# Patient Record
Sex: Male | Born: 1975 | Race: White | Hispanic: No | Marital: Married | Smoking: Former smoker
Health system: Southern US, Community
[De-identification: ages and names within clinical notes are randomized; demographics above are authoritative.]

## PROBLEM LIST (undated history)

## (undated) DIAGNOSIS — F419 Anxiety disorder, unspecified: Secondary | ICD-10-CM

## (undated) DIAGNOSIS — L659 Nonscarring hair loss, unspecified: Secondary | ICD-10-CM

## (undated) HISTORY — PX: EYE SURGERY: SHX253

## (undated) HISTORY — PX: ANTERIOR CRUCIATE LIGAMENT REPAIR: SHX115

## (undated) HISTORY — DX: Nonscarring hair loss, unspecified: L65.9

## (undated) HISTORY — PX: OTHER SURGICAL HISTORY: SHX169

## (undated) HISTORY — PX: PATELLA FRACTURE SURGERY: SHX735

## (undated) HISTORY — DX: Anxiety disorder, unspecified: F41.9

---

## 2012-12-13 ENCOUNTER — Encounter: Payer: Self-pay | Admitting: Family Medicine

## 2012-12-13 DIAGNOSIS — F419 Anxiety disorder, unspecified: Secondary | ICD-10-CM | POA: Insufficient documentation

## 2012-12-13 DIAGNOSIS — L659 Nonscarring hair loss, unspecified: Secondary | ICD-10-CM | POA: Insufficient documentation

## 2012-12-13 DIAGNOSIS — Z8349 Family history of other endocrine, nutritional and metabolic diseases: Secondary | ICD-10-CM | POA: Insufficient documentation

## 2012-12-14 ENCOUNTER — Ambulatory Visit: Payer: Self-pay | Admitting: Physician Assistant

## 2013-09-14 DIAGNOSIS — Z7982 Long term (current) use of aspirin: Secondary | ICD-10-CM | POA: Insufficient documentation

## 2013-09-14 DIAGNOSIS — Y9239 Other specified sports and athletic area as the place of occurrence of the external cause: Secondary | ICD-10-CM | POA: Insufficient documentation

## 2013-09-14 DIAGNOSIS — S82009A Unspecified fracture of unspecified patella, initial encounter for closed fracture: Secondary | ICD-10-CM | POA: Insufficient documentation

## 2013-09-14 DIAGNOSIS — Z8659 Personal history of other mental and behavioral disorders: Secondary | ICD-10-CM | POA: Insufficient documentation

## 2013-09-14 DIAGNOSIS — Z792 Long term (current) use of antibiotics: Secondary | ICD-10-CM | POA: Insufficient documentation

## 2013-09-14 DIAGNOSIS — Y92838 Other recreation area as the place of occurrence of the external cause: Secondary | ICD-10-CM

## 2013-09-14 DIAGNOSIS — Y9362 Activity, american flag or touch football: Secondary | ICD-10-CM | POA: Insufficient documentation

## 2013-09-14 DIAGNOSIS — Z87891 Personal history of nicotine dependence: Secondary | ICD-10-CM | POA: Insufficient documentation

## 2013-09-14 DIAGNOSIS — W219XXA Striking against or struck by unspecified sports equipment, initial encounter: Secondary | ICD-10-CM | POA: Insufficient documentation

## 2013-09-15 ENCOUNTER — Encounter (HOSPITAL_COMMUNITY): Payer: Self-pay | Admitting: Emergency Medicine

## 2013-09-15 ENCOUNTER — Emergency Department (HOSPITAL_COMMUNITY)
Admission: EM | Admit: 2013-09-15 | Discharge: 2013-09-15 | Disposition: A | Discharge status: Home / Self Care | Payer: BC Managed Care – PPO | Attending: Emergency Medicine | Admitting: Emergency Medicine

## 2013-09-15 ENCOUNTER — Encounter (HOSPITAL_COMMUNITY): Payer: Self-pay | Admitting: *Deleted

## 2013-09-15 ENCOUNTER — Emergency Department (HOSPITAL_COMMUNITY): Payer: BC Managed Care – PPO

## 2013-09-15 DIAGNOSIS — S82001A Unspecified fracture of right patella, initial encounter for closed fracture: Secondary | ICD-10-CM

## 2013-09-15 MED ORDER — OXYCODONE-ACETAMINOPHEN 5-325 MG PO TABS: 2.0000 | ORAL_TABLET | ORAL | Status: DC | PRN

## 2013-09-15 MED ORDER — MORPHINE SULFATE 4 MG/ML IJ SOLN
4.0000 mg | Freq: Once | INTRAMUSCULAR | Status: AC
  Administered 2013-09-15: 4 mg via INTRAMUSCULAR
  Filled 2013-09-15: qty 1

## 2013-09-15 MED ORDER — OXYCODONE-ACETAMINOPHEN 5-325 MG PO TABS: 1.0000 | ORAL_TABLET | Freq: Once | ORAL | Status: DC

## 2013-09-15 MED ORDER — OXYCODONE-ACETAMINOPHEN 5-325 MG PO TABS
1.0000 | ORAL_TABLET | Freq: Once | ORAL | Status: AC
  Administered 2013-09-15: 1 via ORAL
  Filled 2013-09-15: qty 1

## 2013-09-15 NOTE — Discharge Instructions (Signed)
Called [facility] to be seen tomorrow. Keep leg elevated. May use ice to decrease swelling. Take medication as prescribed.  Patellar Fracture, Adult A patellar fracture is a break in the little round bone (kneecap) that is the bump on the front of your knee. A direct blow to the knee, or fall, is usually the cause of a broken patella. Sometimes, a very hard and strong bending of the knee (like jumping events in sports) can cause a fracture. Usually the knee is tender and swollen and has pain with motion, especially trying to straighten out the leg. There may be difficulty walking or putting weight on the affected side. These fractures generally heal in about 4 to 6 weeks. DIAGNOSIS  The diagnosis is usually easily made with an exam and x-ray. TREATMENT  Treatment is dependent on the type of fracture:  If the fracture is undisplaced or only slightly displaced (this means the position of the parts is good) then, after any blood in the joint has been removed, and if you can still straighten your leg out, you can usually be treated with a splint or cast for 4 to 6 weeks. Straightening out your leg is called extension and the ability to do this is with the extensor mechanism. Every day while in treatment quadriceps exercises should be performed, or as directed by your caregivers.  If there is a stellate (comminuted) fracture of the patella (this means the patella is in multiple small pieces), the blood in the joint may be removed and if you are able to straighten your leg, you can usually be treated with a splint or cast for 4 to 6 weeks. Sometimes this type of fracture may be treated by removing the patella. You will still have a good knee without a patella. If this is done the knee still will need to be in a plaster cast or splint for the next 4 to 6 weeks.  If the fracture is a displaced transverse fracture and you cannot extend (straighten out your leg), then an operation is required to hold  the bony fragments together until they heal. Again a plaster cast or splint is worn until the extension mechanism of the knee is regained. This means the knee is healed and you can straighten out your leg again normally.  Only take over-the-counter or prescription medicines for pain, discomfort, or fever as directed by your caregiver.  Warning: Do not drive a car or operate a motor vehicle until your caregiver specifically tells you it is safe to do so. HOME CARE INSTRUCTIONS   You may resume normal diet and activities as directed or allowed.  Keep ice packs (a bag of ice wrapped in a towel) on the knee for twenty minutes, four times per day, for the first two days.  Change dressings if necessary or as directed.  Only take over-the-counter or prescription medicines for pain, discomfort, or fever as directed by your caregiver.  Use crutches as directed and exercise leg as directed.  Keep appointments as directed. SEEK IMMEDIATE MEDICAL CARE IF :  Redness, swelling, or increasing pain in the knee.  An unexplained oral temperature above 102 F (38.9 C) develops. Document Released: 05/23/2003 Document Revised: 11/16/2011 Document Reviewed: 04/05/2013 [facility] Patient Information 2014 [place], [place].

## 2013-09-15 NOTE — ED Notes (Signed)
Ran into wall while playing football and hyperextended right knee, swelling noted, no other injuries, A/O X4 and in NAD

## 2013-09-15 NOTE — Progress Notes (Signed)
Surgery scheduled for 09/19/13.  Need orders in EPIC.  Thank You.

## 2013-09-15 NOTE — Progress Notes (Signed)
Patient has hx of marijuana use.  Last used on 09/14/13.  Pt also reports use of Xanax not prescribed to patient.  Last use of Xanax 09/07/2013 approximately.  Urine Drug screen ordered on patient for day of surgery.  Patient does not know dose of Xanax.  Describes as a blue football.

## 2013-09-18 ENCOUNTER — Other Ambulatory Visit: Payer: Self-pay | Admitting: Orthopedic Surgery

## 2013-09-19 ENCOUNTER — Encounter (HOSPITAL_COMMUNITY)
Admission: RE | Disposition: A | Discharge status: Home / Self Care | Payer: Self-pay | Source: Ambulatory Visit | Attending: Specialist

## 2013-09-19 ENCOUNTER — Ambulatory Visit (HOSPITAL_COMMUNITY)
Admission: RE | Admit: 2013-09-19 | Discharge: 2013-09-19 | Disposition: A | Discharge status: Home / Self Care | Payer: BC Managed Care – PPO | Source: Ambulatory Visit | Attending: Specialist | Admitting: Specialist

## 2013-09-19 ENCOUNTER — Encounter (HOSPITAL_COMMUNITY): Payer: Self-pay | Admitting: *Deleted

## 2013-09-19 ENCOUNTER — Encounter (HOSPITAL_COMMUNITY): Payer: BC Managed Care – PPO | Admitting: Anesthesiology

## 2013-09-19 ENCOUNTER — Ambulatory Visit (HOSPITAL_COMMUNITY): Payer: BC Managed Care – PPO | Admitting: Anesthesiology

## 2013-09-19 DIAGNOSIS — Y9367 Activity, basketball: Secondary | ICD-10-CM | POA: Insufficient documentation

## 2013-09-19 DIAGNOSIS — F17200 Nicotine dependence, unspecified, uncomplicated: Secondary | ICD-10-CM | POA: Insufficient documentation

## 2013-09-19 DIAGNOSIS — S82009A Unspecified fracture of unspecified patella, initial encounter for closed fracture: Secondary | ICD-10-CM

## 2013-09-19 DIAGNOSIS — X58XXXA Exposure to other specified factors, initial encounter: Secondary | ICD-10-CM | POA: Insufficient documentation

## 2013-09-19 HISTORY — PX: ORIF PATELLA: SHX5033

## 2013-09-19 LAB — RAPID URINE DRUG SCREEN, HOSP PERFORMED
AMPHETAMINES: NOT DETECTED
Barbiturates: NOT DETECTED
Benzodiazepines: NOT DETECTED
COCAINE: NOT DETECTED
Opiates: NOT DETECTED
TETRAHYDROCANNABINOL: POSITIVE — AB

## 2013-09-19 LAB — COMPREHENSIVE METABOLIC PANEL
ALBUMIN: 4.2 g/dL (ref 3.5–5.2)
ALT: 36 U/L (ref 0–53)
AST: 27 U/L (ref 0–37)
Alkaline Phosphatase: 69 U/L (ref 39–117)
BUN: 13 mg/dL (ref 6–23)
CALCIUM: 9.2 mg/dL (ref 8.4–10.5)
CO2: 26 meq/L (ref 19–32)
CREATININE: 0.87 mg/dL (ref 0.50–1.35)
Chloride: 102 mEq/L (ref 96–112)
GFR calc Af Amer: >90 mL/min (ref >90)
Glucose, Bld: 97 mg/dL (ref 70–99)
Potassium: 4.2 mEq/L (ref 3.7–5.3)
SODIUM: 139 meq/L (ref 137–147)
TOTAL PROTEIN: 7.1 g/dL (ref 6.0–8.3)
Total Bilirubin: 0.7 mg/dL (ref 0.3–1.2)

## 2013-09-19 LAB — CBC
HCT: 41.9 % (ref 39.0–52.0)
Hemoglobin: 15.3 g/dL (ref 13.0–17.0)
MCH: 32 pg (ref 26.0–34.0)
MCHC: 36.5 g/dL — AB (ref 30.0–36.0)
MCV: 87.7 fL (ref 78.0–100.0)
PLATELETS: 242 10*3/uL (ref 150–400)
RBC: 4.78 MIL/uL (ref 4.22–5.81)
RDW: 12.7 % (ref 11.5–15.5)
WBC: 13.9 10*3/uL — ABNORMAL HIGH (ref 4.0–10.5)

## 2013-09-19 SURGERY — OPEN REDUCTION INTERNAL FIXATION (ORIF) PATELLA
Anesthesia: Regional | Site: Knee | Laterality: Right

## 2013-09-19 MED ORDER — ASPIRIN EC 325 MG PO TBEC: 325.0000 mg | DELAYED_RELEASE_TABLET | Freq: Two times a day (BID) | ORAL | Status: DC

## 2013-09-19 MED ORDER — LACTATED RINGERS IV SOLN: INTRAVENOUS | Status: DC

## 2013-09-19 MED ORDER — MIDAZOLAM HCL 5 MG/5ML IJ SOLN
INTRAMUSCULAR | Status: DC | PRN
  Administered 2013-09-19: 2 mg via INTRAVENOUS

## 2013-09-19 MED ORDER — ONDANSETRON HCL 4 MG/2ML IJ SOLN
INTRAMUSCULAR | Status: DC | PRN
  Administered 2013-09-19: 4 mg via INTRAVENOUS

## 2013-09-19 MED ORDER — POVIDONE-IODINE 7.5 % EX SOLN: Freq: Once | CUTANEOUS | Status: DC

## 2013-09-19 MED ORDER — OXYCODONE-ACETAMINOPHEN 5-325 MG PO TABS
ORAL_TABLET | ORAL | Status: AC
  Filled 2013-09-19: qty 1

## 2013-09-19 MED ORDER — BUPIVACAINE HCL (PF) 0.25 % IJ SOLN
INTRAMUSCULAR | Status: AC
  Filled 2013-09-19: qty 30

## 2013-09-19 MED ORDER — FENTANYL CITRATE 0.05 MG/ML IJ SOLN
INTRAMUSCULAR | Status: AC
  Filled 2013-09-19: qty 2

## 2013-09-19 MED ORDER — MIDAZOLAM HCL 2 MG/2ML IJ SOLN
INTRAMUSCULAR | Status: AC
  Filled 2013-09-19: qty 2

## 2013-09-19 MED ORDER — ROPIVACAINE HCL 5 MG/ML IJ SOLN
INTRAMUSCULAR | Status: AC
  Filled 2013-09-19: qty 30

## 2013-09-19 MED ORDER — DEXAMETHASONE SODIUM PHOSPHATE 10 MG/ML IJ SOLN
INTRAMUSCULAR | Status: DC | PRN
  Administered 2013-09-19: 10 mg via INTRAVENOUS

## 2013-09-19 MED ORDER — HYDROMORPHONE HCL PF 1 MG/ML IJ SOLN
INTRAMUSCULAR | Status: DC
Start: 2013-09-19 — End: 2013-09-19
  Filled 2013-09-19: qty 1

## 2013-09-19 MED ORDER — HYDROMORPHONE HCL PF 1 MG/ML IJ SOLN: 0.2500 mg | INTRAMUSCULAR | Status: DC | PRN

## 2013-09-19 MED ORDER — OXYCODONE-ACETAMINOPHEN 7.5-325 MG PO TABS: 1.0000 | ORAL_TABLET | ORAL | Status: DC | PRN

## 2013-09-19 MED ORDER — FENTANYL CITRATE 0.05 MG/ML IJ SOLN
INTRAMUSCULAR | Status: DC | PRN
  Administered 2013-09-19: 25 ug via INTRAVENOUS
  Administered 2013-09-19: 50 ug via INTRAVENOUS
  Administered 2013-09-19: 25 ug via INTRAVENOUS
  Administered 2013-09-19: 50 ug via INTRAVENOUS
  Administered 2013-09-19 (×4): 25 ug via INTRAVENOUS
  Administered 2013-09-19: 50 ug via INTRAVENOUS
  Administered 2013-09-19: 25 ug via INTRAVENOUS
  Administered 2013-09-19: 50 ug via INTRAVENOUS
  Administered 2013-09-19: 25 ug via INTRAVENOUS

## 2013-09-19 MED ORDER — ROPIVACAINE HCL 5 MG/ML IJ SOLN
INTRAMUSCULAR | Status: DC | PRN
  Administered 2013-09-19: 30 mL via PERINEURAL

## 2013-09-19 MED ORDER — KETOROLAC TROMETHAMINE 30 MG/ML IJ SOLN
INTRAMUSCULAR | Status: AC
  Filled 2013-09-19: qty 1

## 2013-09-19 MED ORDER — CEFAZOLIN SODIUM-DEXTROSE 2-3 GM-% IV SOLR
2.0000 g | INTRAVENOUS | Status: AC
  Administered 2013-09-19: 2 g via INTRAVENOUS

## 2013-09-19 MED ORDER — CEPHALEXIN 500 MG PO CAPS: 500.0000 mg | ORAL_CAPSULE | Freq: Three times a day (TID) | ORAL | Status: DC

## 2013-09-19 MED ORDER — METHOCARBAMOL 500 MG PO TABS: 500.0000 mg | ORAL_TABLET | Freq: Four times a day (QID) | ORAL | Status: DC | PRN

## 2013-09-19 MED ORDER — PROPOFOL 10 MG/ML IV BOLUS
INTRAVENOUS | Status: AC
  Filled 2013-09-19: qty 20

## 2013-09-19 MED ORDER — PROMETHAZINE HCL 25 MG/ML IJ SOLN: 6.2500 mg | INTRAMUSCULAR | Status: DC | PRN

## 2013-09-19 MED ORDER — SODIUM CHLORIDE 0.9 % IV SOLN
INTRAVENOUS | Status: DC
Start: 2013-09-19 — End: 2013-09-20

## 2013-09-19 MED ORDER — PROPOFOL 10 MG/ML IV BOLUS
INTRAVENOUS | Status: DC | PRN
  Administered 2013-09-19: 50 mg via INTRAVENOUS
  Administered 2013-09-19: 200 mg via INTRAVENOUS

## 2013-09-19 MED ORDER — CEFAZOLIN SODIUM-DEXTROSE 2-3 GM-% IV SOLR
2.0000 g | Freq: Once | INTRAVENOUS | Status: DC
  Filled 2013-09-19: qty 50

## 2013-09-19 MED ORDER — OXYCODONE-ACETAMINOPHEN 5-325 MG PO TABS
1.0000 | ORAL_TABLET | ORAL | Status: DC | PRN
  Administered 2013-09-19: 1 via ORAL

## 2013-09-19 MED ORDER — BUPIVACAINE HCL 0.25 % IJ SOLN
INTRAMUSCULAR | Status: DC | PRN
  Administered 2013-09-19: 10 mL

## 2013-09-19 MED ORDER — CEFAZOLIN SODIUM-DEXTROSE 2-3 GM-% IV SOLR
INTRAVENOUS | Status: AC
  Filled 2013-09-19: qty 50

## 2013-09-19 MED ORDER — DEXAMETHASONE SODIUM PHOSPHATE 10 MG/ML IJ SOLN
INTRAMUSCULAR | Status: AC
  Filled 2013-09-19: qty 1

## 2013-09-19 MED ORDER — 0.9 % SODIUM CHLORIDE (POUR BTL) OPTIME
TOPICAL | Status: DC | PRN
  Administered 2013-09-19: 1000 mL

## 2013-09-19 MED ORDER — KETOROLAC TROMETHAMINE 30 MG/ML IJ SOLN
INTRAMUSCULAR | Status: DC | PRN
  Administered 2013-09-19: 30 mg via INTRAVENOUS

## 2013-09-19 MED ORDER — LACTATED RINGERS IV SOLN
INTRAVENOUS | Status: DC | PRN
  Administered 2013-09-19 (×2): via INTRAVENOUS

## 2013-09-19 MED ORDER — ONDANSETRON HCL 4 MG/2ML IJ SOLN
INTRAMUSCULAR | Status: AC
  Filled 2013-09-19: qty 2

## 2013-09-19 SURGICAL SUPPLY — 57 items
ADH SKN CLS APL DERMABOND .7 (GAUZE/BANDAGES/DRESSINGS) ×1
BAG SPEC THK2 15X12 ZIP CLS (MISCELLANEOUS) ×1
BAG ZIPLOCK 12X15 (MISCELLANEOUS) ×2 IMPLANT
BANDAGE ELASTIC 4 VELCRO ST LF (GAUZE/BANDAGES/DRESSINGS) ×1 IMPLANT
BANDAGE ELASTIC 6 VELCRO ST LF (GAUZE/BANDAGES/DRESSINGS) ×1 IMPLANT
BNDG COHESIVE 4X5 TAN STRL (GAUZE/BANDAGES/DRESSINGS) ×1 IMPLANT
BNDG COHESIVE 6X5 TAN STRL LF (GAUZE/BANDAGES/DRESSINGS) ×1 IMPLANT
CUFF TOURN SGL QUICK 34 (TOURNIQUET CUFF) ×2
CUFF TRNQT CYL 34X4X40X1 (TOURNIQUET CUFF) ×1 IMPLANT
DERMABOND ADVANCED (GAUZE/BANDAGES/DRESSINGS) ×1
DERMABOND ADVANCED .7 DNX12 (GAUZE/BANDAGES/DRESSINGS) IMPLANT
DRAPE INCISE IOBAN 66X45 STRL (DRAPES) ×2 IMPLANT
DRAPE LG THREE QUARTER DISP (DRAPES) ×2 IMPLANT
DRAPE OEC MINIVIEW 54X84 (DRAPES) ×2 IMPLANT
DRAPE U-SHAPE 47X51 STRL (DRAPES) ×2 IMPLANT
DRSG AQUACEL AG ADV 3.5X10 (GAUZE/BANDAGES/DRESSINGS) ×1 IMPLANT
DRSG EMULSION OIL 3X16 NADH (GAUZE/BANDAGES/DRESSINGS) ×2 IMPLANT
DURAPREP 26ML APPLICATOR (WOUND CARE) ×2 IMPLANT
ELECT REM PT RETURN 9FT ADLT (ELECTROSURGICAL) ×2
ELECTRODE REM PT RTRN 9FT ADLT (ELECTROSURGICAL) ×1 IMPLANT
GLOVE BIOGEL PI IND STRL 8 (GLOVE) ×1 IMPLANT
GLOVE BIOGEL PI INDICATOR 8 (GLOVE) ×1
GLOVE SURG ORTHO 8.0 STRL STRW (GLOVE) ×2 IMPLANT
GLOVE SURG SS PI 7.5 STRL IVOR (GLOVE) ×2 IMPLANT
GOWN STRL REUS W/TWL XL LVL3 (GOWN DISPOSABLE) ×2 IMPLANT
IMMOBILIZER KNEE 20 (SOFTGOODS) ×2 IMPLANT
IMMOBILIZER KNEE 22 UNIV (SOFTGOODS) ×1 IMPLANT
K-WIRE 2.0X150M (WIRE) ×8
K-WIRE ACE 1.6X6 (WIRE) ×8
KIT BASIN OR (CUSTOM PROCEDURE TRAY) ×2 IMPLANT
KWIRE 2.0X150M (WIRE) IMPLANT
KWIRE ACE 1.6X6 (WIRE) IMPLANT
MANIFOLD NEPTUNE II (INSTRUMENTS) ×2 IMPLANT
NDL MA TROC 1/2 (NEEDLE) ×1 IMPLANT
NEEDLE MA TROC 1/2 (NEEDLE) ×2 IMPLANT
NS IRRIG 1000ML POUR BTL (IV SOLUTION) ×2 IMPLANT
PACK LOWER EXTREMITY WL (CUSTOM PROCEDURE TRAY) ×2 IMPLANT
PAD CAST 4YDX4 CTTN HI CHSV (CAST SUPPLIES) ×2 IMPLANT
PADDING CAST COTTON 4X4 STRL (CAST SUPPLIES) ×4
PASSER SUT SWANSON 36MM LOOP (INSTRUMENTS) ×2 IMPLANT
POSITIONER SURGICAL ARM (MISCELLANEOUS) ×2 IMPLANT
SPONGE GAUZE 4X4 12PLY (GAUZE/BANDAGES/DRESSINGS) ×4 IMPLANT
SPONGE LAP 18X18 X RAY DECT (DISPOSABLE) ×2 IMPLANT
STOCKINETTE 8 INCH (MISCELLANEOUS) ×2 IMPLANT
STRIP CLOSURE SKIN 1/2X4 (GAUZE/BANDAGES/DRESSINGS) ×2 IMPLANT
SUCTION FRAZIER TIP 10 FR DISP (SUCTIONS) ×2 IMPLANT
SUT ETHILON 4 0 PS 2 18 (SUTURE) ×2 IMPLANT
SUT FIBERWIRE #2 38 T-5 BLUE (SUTURE) ×6
SUT MNCRL AB 4-0 PS2 18 (SUTURE) ×2 IMPLANT
SUT VIC AB 1 CT1 27 (SUTURE) ×4
SUT VIC AB 1 CT1 27XBRD ANTBC (SUTURE) ×2 IMPLANT
SUT VIC AB 2-0 CT1 27 (SUTURE) ×4
SUT VIC AB 2-0 CT1 TAPERPNT 27 (SUTURE) ×2 IMPLANT
SUT WIRE 18GA (Wire) ×4 IMPLANT
SUTURE FIBERWR #2 38 T-5 BLUE (SUTURE) ×3 IMPLANT
TOWEL OR 17X26 10 PK STRL BLUE (TOWEL DISPOSABLE) ×4 IMPLANT
WATER STERILE IRR 1500ML POUR (IV SOLUTION) ×2 IMPLANT

## 2013-09-19 NOTE — H&P (View-Only) (Signed)
Surgery scheduled for 09/19/13.  Need orders in EPIC.  Thank You.  

## 2013-09-19 NOTE — Op Note (Signed)
Dictated # [id number]

## 2013-09-19 NOTE — Interval H&P Note (Signed)
History and Physical Interval Note:  09/19/2013 5:29 PM  Theodore Sanchez  has presented today for surgery, with the diagnosis of right knee patella fracture  The various methods of treatment have been discussed with the patient and family. After consideration of risks, benefits and other options for treatment, the patient has consented to  Procedure(s): OPEN REDUCTION INTERNAL (ORIF) FIXATION RIGHT KNEE PATELLA (Right) as a surgical intervention .  The patient's history has been reviewed, patient examined, no change in status, stable for surgery.  I have reviewed the patient's chart and labs.  Questions were answered to the patient's satisfaction.     [name] ANDREW   

## 2013-09-19 NOTE — Interval H&P Note (Signed)
History and Physical Interval Note:  09/19/2013 5:48 PM  Theodore Sanchez  has presented today for surgery, with the diagnosis of right knee patella fracture  The various methods of treatment have been discussed with the patient and family. After consideration of risks, benefits and other options for treatment, the patient has consented to  Procedure(s): OPEN REDUCTION INTERNAL (ORIF) FIXATION RIGHT KNEE PATELLA (Right) as a surgical intervention .  The patient's history has been reviewed, patient examined, no change in status, stable for surgery.  I have reviewed the patient's chart and labs.  Questions were answered to the patient's satisfaction.     Miosotis Wetsel ANDREW

## 2013-09-19 NOTE — Interval H&P Note (Signed)
History and Physical Interval Note:  09/19/2013 5:29 PM  Theodore Sanchez  has presented today for surgery, with the diagnosis of right knee patella fracture  The various methods of treatment have been discussed with the patient and family. After consideration of risks, benefits and other options for treatment, the patient has consented to  Procedure(s): OPEN REDUCTION INTERNAL (ORIF) FIXATION RIGHT KNEE PATELLA (Right) as a surgical intervention .  The patient's history has been reviewed, patient examined, no change in status, stable for surgery.  I have reviewed the patient's chart and labs.  Questions were answered to the patient's satisfaction.     Elysha Daw ANDREW

## 2013-09-19 NOTE — Interval H&P Note (Signed)
History and Physical Interval Note:  09/19/2013 5:48 PM  Theodore Sanchez  has presented today for surgery, with the diagnosis of right knee patella fracture  The various methods of treatment have been discussed with the patient and family. After consideration of risks, benefits and other options for treatment, the patient has consented to  Procedure(s): OPEN REDUCTION INTERNAL (ORIF) FIXATION RIGHT KNEE PATELLA (Right) as a surgical intervention .  The patient's history has been reviewed, patient examined, no change in status, stable for surgery.  I have reviewed the patient's chart and labs.  Questions were answered to the patient's satisfaction.     [name] ANDREW   

## 2013-09-19 NOTE — H&P (View-Only) (Signed)
Patient has hx of marijuana use.  Last used on 09/14/13.  Pt also reports use of Xanax not prescribed to patient.  Last use of Xanax 09/07/2013 approximately.  Urine Drug screen ordered on patient for day of surgery.  Patient does not know dose of Xanax.  Describes as a blue football.   

## 2013-09-19 NOTE — Anesthesia Procedure Notes (Signed)
Anesthesia Regional Block:  Femoral nerve block  Pre-Anesthetic Checklist: ,, timeout performed, Correct Patient, Correct Site, Correct Laterality, Correct Procedure, Correct Position, site marked, Risks and benefits discussed,  Surgical consent,  Pre-op evaluation,  At surgeon's request and post-op pain management  Laterality: Right and Lower  Prep: chloraprep       Needles:  Injection technique: Single-shot  Needle Type: Stimulator Needle - 80      Needle Gauge: 22 and 22 G    Additional Needles:  Procedures: ultrasound guided (picture in chart) and nerve stimulator Femoral nerve block Narrative:  Start time: 09/19/2013 4:30 PM End time: 09/19/2013 4:40 PM Injection made incrementally with aspirations every 5 mL.  Performed by: Personally  Anesthesiologist: Lucille PassyAlexander Courtenay Creger MD

## 2013-09-19 NOTE — Anesthesia Preprocedure Evaluation (Signed)
Anesthesia Evaluation  Patient identified by MRN, date of birth, ID band Patient awake    Reviewed: Allergy & Precautions, H&P , NPO status , Patient's Chart, lab work & pertinent test results  Airway Mallampati: II TM Distance: >3 FB Neck ROM: Full    Dental  (+) Teeth Intact and Dental Advisory Given   Pulmonary neg pulmonary ROS, former smoker,  breath sounds clear to auscultation  Pulmonary exam normal       Cardiovascular negative cardio ROS  Rhythm:Regular Rate:Normal     Neuro/Psych negative neurological ROS  negative psych ROS   GI/Hepatic negative GI ROS, Neg liver ROS,   Endo/Other  negative endocrine ROS  Renal/GU negative Renal ROS  negative genitourinary   Musculoskeletal negative musculoskeletal ROS (+)   Abdominal   Peds negative pediatric ROS (+)  Hematology negative hematology ROS (+)   Anesthesia Other Findings   Reproductive/Obstetrics negative OB ROS                           Anesthesia Physical Anesthesia Plan  ASA: I  Anesthesia Plan: General and Regional   Post-op Pain Management:    Induction: Intravenous  Airway Management Planned: Oral ETT  Additional Equipment:   Intra-op Plan:   Post-operative Plan: Extubation in OR  Informed Consent: I have reviewed the patients History and Physical, chart, labs and discussed the procedure including the risks, benefits and alternatives for the proposed anesthesia with the patient or authorized representative who has indicated his/her understanding and acceptance.   Dental advisory given  Plan Discussed with: CRNA  Anesthesia Plan Comments:         Anesthesia Quick Evaluation

## 2013-09-19 NOTE — Transfer of Care (Signed)
Immediate Anesthesia Transfer of Care Note  Patient: Theodore Sanchez  Procedure(s) Performed: Procedure(s): OPEN REDUCTION INTERNAL (ORIF) FIXATION RIGHT KNEE PATELLA (Right)  Patient Location: PACU  Anesthesia Type:General, Regional and GA combined with regional for post-op pain  Level of Consciousness: awake, alert , oriented and patient cooperative  Airway & Oxygen Therapy: Patient Spontanous Breathing and Patient connected to face mask oxygen  Post-op Assessment: Report given to PACU RN and Post -op Vital signs reviewed and stable  Post vital signs: Reviewed and stable  Complications: No apparent anesthesia complications

## 2013-09-19 NOTE — Anesthesia Postprocedure Evaluation (Signed)
Anesthesia Post Note  Patient: Theodore Sanchez  Procedure(s) Performed: Procedure(s) (LRB): OPEN REDUCTION INTERNAL (ORIF) FIXATION RIGHT KNEE PATELLA (Right)  Anesthesia type: General  Patient location: PACU  Post pain: Pain level controlled  Post assessment: Post-op Vital signs reviewed  Last Vitals:  Filed Vitals:   09/19/13 2130  BP:   Pulse: 77  Temp:   Resp:     Post vital signs: Reviewed  Level of consciousness: sedated  Complications: No apparent anesthesia complications

## 2013-09-19 NOTE — H&P (Signed)
Theodore Sanchez is an 38 y.o. male.   Chief Complaint: Right patella fracture HPI: Patient presents with right knee pain follow a basketball injury five days ago. He immediately went to the ED, where radiographs confirmed a right patella fracture. He then followed up in the office yesterday with Dr. Thomasena Edisollins. Treatment options were discussed. Patient and family wishes to preceed with surgical intervention as consented. Risk and benefits were also discussed in detail. Denies SOB, Calf pain, or CP.   Past Medical History  Diagnosis Date  . Alopecia   . Anxiety     Past Surgical History  Procedure Laterality Date  . Anterior cruciate ligament repair      2009   . Eye surgery      knife in eye surgery- left eye   . Eye surgery      right eye- due to dirt in eye   . Dog bite surgery       age 81 - plastic surgery on face     History reviewed. No pertinent family history. Social History:  reports that he has quit smoking. His smokeless tobacco use includes Snuff. He reports that he uses illicit drugs (Marijuana). He reports that he does not drink alcohol.  Allergies: No Known Allergies  Medications Prior to Admission  Medication Sig Dispense Refill  . ibuprofen (ADVIL,MOTRIN) 200 MG tablet Take 400 mg by mouth every 6 (six) hours as needed.      [name]. oxyCODONE-acetaminophen (PERCOCET) 5-325 MG per tablet Take 2 tablets by mouth every 4 (four) hours as needed.  20 tablet  0    Results for orders placed during the hospital encounter of 09/19/13 (from the past 48 hour(s))  CBC     Status: Abnormal   Collection Time    09/19/13  2:30 PM      Result Value Range   WBC 13.9 (*) 4.0 - 10.5 K/uL   RBC 4.78  4.22 - 5.81 MIL/uL   Hemoglobin 15.3  13.0 - 17.0 g/dL   HCT 16.141.9  09.639.0 - 04.552.0 %   MCV 87.7  78.0 - 100.0 fL   MCH 32.0  26.0 - 34.0 pg   MCHC 36.5 (*) 30.0 - 36.0 g/dL   RDW 40.912.7  81.111.5 - 91.415.5 %   Platelets 242  150 - 400 K/uL   No results found.  Review of Systems  Constitutional:  Negative.   HENT: Negative.   Eyes: Negative.   Respiratory: Negative.   Cardiovascular: Negative.   Gastrointestinal: Negative.   Genitourinary: Negative.   Musculoskeletal: Positive for joint pain.  Skin: Negative.   Neurological: Negative.   Endo/Heme/Allergies: Negative.   Psychiatric/Behavioral: Negative.     Blood pressure 118/65, pulse 60, temperature 97.5 F (36.4 C), temperature source Oral, resp. rate 18, height 5\' 11"  (1.803 m), weight 92.987 kg (205 lb), SpO2 99.00%. Physical Exam  Constitutional: He is oriented to person, place, and time. He appears well-developed.  HENT:  Head: Normocephalic.  Eyes: Pupils are equal, round, and reactive to light.  Neck: Normal range of motion.  Cardiovascular: Normal rate, regular rhythm, normal heart sounds and intact distal pulses.   Respiratory: Effort normal and breath sounds normal.  GI: Soft. Bowel sounds are normal.  Genitourinary:  deferred  Musculoskeletal: He exhibits edema and tenderness.  Rigth knee. Calf soft and non-tender  Neurological: He is alert and oriented to person, place, and time.  Skin: Skin is warm and dry.  Psychiatric: His behavior is normal.  Assessment/Plan Right patella fracture: ORIF of right patella. D/C home with family today Follow up in office. Follow D/c instructions and take medicine as directed  Aron Inge L 09/19/2013, 3:17 PM

## 2013-09-19 NOTE — Discharge Instructions (Signed)
General Anesthesia, Adult General anesthesia is a sleep-like state of non-feeling produced by medicines (anesthetics). General anesthesia prevents you from being alert and feeling pain during a medical procedure. Your caregiver may recommend general anesthesia if your procedure:  Is long.  Is painful or uncomfortable.  Would be frightening to see or hear.  Requires you to be still.  Affects your breathing.  Causes significant blood loss. LET YOUR CAREGIVER KNOW ABOUT:  Allergies to food or medicine.  Medicines taken, including vitamins, herbs, eyedrops, over-the-counter medicines, and creams.  Use of steroids (by mouth or creams).  Previous problems with anesthetics or numbing medicines, including problems experienced by relatives.  History of bleeding problems or blood clots.  Previous surgeries and types of anesthetics received.  Possibility of pregnancy, if this applies.  Use of cigarettes, alcohol, or illegal drugs.  Any health condition(s), especially diabetes, sleep apnea, and high blood pressure. RISKS AND COMPLICATIONS General anesthesia rarely causes complications. However, if complications do occur, they can be life-threatening. Complications include:  A lung infection.  A stroke.  A heart attack.  Waking up during the procedure. When this occurs, the patient may be unable to move and communicate that he or she is awake. The patient may feel severe pain. Older adults and adults with serious medical problems are more likely to have complications than adults who are young and healthy. Some complications can be prevented by answering all of your caregiver's questions thoroughly and by following all pre-procedure instructions. It is important to tell your caregiver if any of the pre-procedure instructions, especially those related to diet, were not followed. Any food or liquid in the stomach can cause problems when you are under general anesthesia. BEFORE THE  PROCEDURE  Ask your caregiver if you will have to spend the night at the hospital. If you will not have to spend the night, arrange to have an adult drive you and stay with you for 24-hours.  Follow your caregiver's instructions if you are taking dietary supplements or medicines. Your caregiver may tell you to stop taking them or to reduce your dosage.  Do not smoke for as long as possible before your procedure. If possible, stop smoking 3 6 weeks before the procedure.  Do not take new dietary supplements or medicines within 1 week of your procedure unless your caregiver approves them.  Do not eat within 8 hours of your procedure or as directed by your caregiver. Drink only clear liquids, such as water, black coffee (without milk or cream), and fruit juices (without pulp).  Do not drink within 3 hours of your procedure or as directed by your caregiver.  You may brush your teeth on the morning of the procedure, but make sure to spit out the toothpaste and water when finished. PROCEDURE  You will receive anesthetics through a mask, through an intravenous (IV) access tube, or through both. A doctor who specializes in anesthesia (anesthesiologist) or a nurse who specializes in anesthesia (nurse anesthetist) or both will stay with you throughout the procedure to make sure you remain unconscious. He or she will also watch your blood pressure, pulse, and oxygen levels to make sure that the anesthetics do not cause any problems. Once you are asleep, a breathing tube or mask may be used to help you breathe. AFTER THE PROCEDURE You will wake up after the procedure is complete. You may be in the room where the procedure was performed or in a recovery area. You may have a sore throat if  a breathing tube was used. You may also feel:  Dizzy.  Weak.  Drowsy.  Confused.  Nauseous.  Cold. These are all normal responses and can be expected to last for up to 24 hours after the procedure is complete. A  caregiver will tell you when you are ready to go home. This will usually be when you are fully awake and in stable condition. Document Released: 12/01/2007 Document Revised: 08/10/2012 Document Reviewed: 12/23/2011 General Anesthesia, Adult, Care After Refer to this sheet in the next few weeks. These instructions provide you with information on caring for yourself after your procedure. Your health care provider may also give you more specific instructions. Your treatment has been planned according to current medical practices, but problems sometimes occur. Call your health care provider if you have any problems or questions after your procedure. WHAT TO EXPECT AFTER THE PROCEDURE After the procedure, it is typical to experience:  Sleepiness.  Nausea and vomiting. HOME CARE INSTRUCTIONS  For the first 24 hours after general anesthesia:  Have a responsible person with you.  Do not drive a car. If you are alone, do not take public transportation.  Do not drink alcohol.  Do not take medicine that has not been prescribed by your health care provider.  Do not sign important papers or make important decisions.  You may resume a normal diet and activities as directed by your health care provider.  Change bandages (dressings) as directed.  If you have questions or problems that seem related to general anesthesia, call the hospital and ask for the anesthetist or anesthesiologist on call. SEEK MEDICAL CARE IF:  You have nausea and vomiting that continue the day after anesthesia.  You develop a rash. SEEK IMMEDIATE MEDICAL CARE IF:   You have difficulty breathing.  You have chest pain.  You have any allergic problems. Document Released: 11/30/2000 Document Revised: 04/26/2013 Document Reviewed: 03/09/2013 [facility] Patient Information 2014 [place], [place]. ExitCare Patient Information 2014 [place], [place]. General Anesthesia, Adult, Care After Refer to this sheet in the next few  weeks. These instructions provide you with information on caring for yourself after your procedure. Your health care provider may also give you more specific instructions. Your treatment has been planned according to current medical practices, but problems sometimes occur. Call your health care provider if you have any problems or questions after your procedure. WHAT TO EXPECT AFTER THE PROCEDURE After the procedure, it is typical to experience:  Sleepiness.  Nausea and vomiting. HOME CARE INSTRUCTIONS  For the first 24 hours after general anesthesia:  Have a responsible person with you.  Do not drive a car. If you are alone, do not take public transportation.  Do not drink alcohol.  Do not take medicine that has not been prescribed by your health care provider.  Do not sign important papers or make important decisions.  You may resume a normal diet and activities as directed by your health care provider.  Change bandages (dressings) as directed.  If you have questions or problems that seem related to general anesthesia, call the hospital and ask for the anesthetist or anesthesiologist on call. SEEK MEDICAL CARE IF:  You have nausea and vomiting that continue the day after anesthesia.  You develop a rash. SEEK IMMEDIATE MEDICAL CARE IF:   You have difficulty breathing.  You have chest pain.  You have any allergic problems. Document Released: 11/30/2000 Document Revised: 04/26/2013 Document Reviewed: 03/09/2013 [facility] Patient Information 2014 [place], [place]. General Anesthesia, Adult, Care  After Refer to this sheet in the next few weeks. These instructions provide you with information on caring for yourself after your procedure. Your health care provider may also give you more specific instructions. Your treatment has been planned according to current medical practices, but problems sometimes occur. Call your health care provider if you have any problems or questions after  your procedure. WHAT TO EXPECT AFTER THE PROCEDURE After the procedure, it is typical to experience:  Sleepiness.  Nausea and vomiting. HOME CARE INSTRUCTIONS  For the first 24 hours after general anesthesia:  Have a responsible person with you.  Do not drive a car. If you are alone, do not take public transportation.  Do not drink alcohol.  Do not take medicine that has not been prescribed by your health care provider.  Do not sign important papers or make important decisions.  You may resume a normal diet and activities as directed by your health care provider.  Change bandages (dressings) as directed.  If you have questions or problems that seem related to general anesthesia, call the hospital and ask for the anesthetist or anesthesiologist on call. SEEK MEDICAL CARE IF:  You have nausea and vomiting that continue the day after anesthesia.  You develop a rash. SEEK IMMEDIATE MEDICAL CARE IF:   You have difficulty breathing.  You have chest pain.  You have any allergic problems. Document Released: 11/30/2000 Document Revised: 04/26/2013 Document Reviewed: 03/09/2013 [facility] Patient Information 2014 [place], [place].

## 2013-09-19 NOTE — Preoperative (Signed)
Beta Blockers   Reason not to administer Beta Blockers:Not Applicable 

## 2013-09-20 ENCOUNTER — Encounter (HOSPITAL_COMMUNITY): Payer: Self-pay | Admitting: Specialist

## 2013-09-20 NOTE — Op Note (Signed)
NAME:  Theodore Sanchez, Theodore Sanchez              ACCOUNT NO.:  [id number]  MEDICAL RECORD NO.:  [id number]  LOCATION:  [facility]                         FACILITY:  [facility]  PHYSICIAN:  Erasmo Leventhal, M.D.DATE OF BIRTH:  1976/02/08  DATE OF PROCEDURE: DATE OF DISCHARGE:  09/19/2013                              OPERATIVE REPORT   PREOPERATIVE DIAGNOSIS:  Right knee comminuted displaced patella fracture.  POSTOP DIAGNOSIS:  Right knee comminuted displaced patella fracture.  PROCEDURE:  Open reduction, internal fixation of right patella fracture. C-arm radiography.  SURGEON:  Erasmo Leventhal, MD.  ASSISTANT:  Arsenio Loader, PA-C.  ANESTHESIA:  Femoral nerve block with general.  ESTIMATED BLOOD LOSS:  Minimal.  TOURNIQUET TIME:  90 minutes at 250 mmHg.  DISPOSITION:  PACU, stable.  OPERATIVE DETAILS:  The patient was counseled in the holding area. Correct site was identified.  IV started, sedation given, and femoral nerve block was administered.  Inside the operative room, IV Ancef was given.  In the OR, the patient was placed under general anesthesia.  All extremities were well padded and bumped.  Right lower extremity was elevated.  He was prepped with DuraPrep and draped in sterile fashion. Time-out was done confirmed by __________, exsanguinated with Esmarch. The tourniquet was inflated to 200 mmHg.  A straight midline incision was made to the skin and subcutaneous tissue.  The retinaculum was opened, the fresh hematoma was encountered.  At this point of time, a meticulous dissection and review of the fracture was performed.  There was found to be three fragments proximal and three fragment distal.  The proximal fragment was then opened, curetted, and irrigated.  The __________ condensed into a single fragment and a lateral to medial plane was placed.  Distally, identical procedure, open fracture was opened, compressed together and a transverse pin was placed to hold  this together to make any single fragment.  Joint was copiously irrigated. Fragment was then held together proximal to distal and two longitudinal 2.0 mm pins were placed.  C-arm confirmed satisfactory reduction of fracture and placed implants.  This was acceptable due to comminution __________ also can palpate digitally the articular surface and it felt excellent.  There was some comminution as a gap that secondary to comminution and fracture compression.  At this time, an 18-gauge wire was then placed in a figure-of-eight position utilizing tension band technique tying down appropriately.  Pins were shortened in length appropriately, being intact and cut into position both proximal distal and mediolateral.  C-arm confirms satisfactory reduction placement and final placement of the implants.  Wounds were copiously irrigated.  The retinaculum closed with Vicryl suture.  Also the arthrotomy was laid side-to-side nicely.  Subcu with Vicryl, skin with subcuticular Monocryl suture.  Steri-Strips were applied, 20 mL of persistent skin edges. Sterile compression dressing was applied.  Tourniquet deflated.  Normal circulation __________ case.  He was then awakened, and taken out of the operating room in stable condition.  He will be stabilized in PACU, and discharged to home.  To help the patient positioning, prepping, draping, technical and surgical assistance throughout entire case, wound closure, application of dressing, and knee immobilizer, Mr. Arsenio Loader, PA-C, assistance was needed.  ______________________________ Erasmo Leventhalobert Andrew Alejandra Barna, M.D.     RAC/MEDQ  D:  09/19/2013  T:  09/20/2013  Job:  [id number]

## 2013-09-20 NOTE — ED Provider Notes (Signed)
CSN: [id number]     Arrival date & time 09/14/13  2356 History   First MD Initiated Contact with Patient 09/15/13 0257     Chief Complaint  Patient presents with  . Knee Injury   (Consider location/radiation/quality/duration/timing/severity/associated sxs/prior Treatment) HPI Patient states he ran into a concrete wall while playing flag football. His anterior he struck the wall. There was no hyperextension of the right knee. He noted deformity of the patella immediately. He's been unable to extend his right knee. He's had swelling at the site of pain. He has no head or neck injury. He has no numbness to the right lower extremity. Past Medical History  Diagnosis Date  . Alopecia   . Anxiety    Past Surgical History  Procedure Laterality Date  . Anterior cruciate ligament repair      2009   . Eye surgery      knife in eye surgery- left eye   . Eye surgery      right eye- due to dirt in eye   . Dog bite surgery       age 20 - plastic surgery on face    No family history on file. History  Substance Use Topics  . Smoking status: Former Games developermoker  . Smokeless tobacco: Current User    Types: Snuff  . Alcohol Use: No     Comment: hx of in 20s     Review of Systems  Musculoskeletal: Positive for arthralgias and joint swelling. Negative for neck pain and neck stiffness.  Skin: Positive for wound.  Neurological: Positive for weakness (inability to extend right knee). Negative for dizziness, light-headedness and numbness.  All other systems reviewed and are negative.    Allergies  Review of patient's allergies indicates no known allergies.  Home Medications   Current Outpatient Rx  Name  Route  Sig  Dispense  Refill  . aspirin EC 325 MG tablet   Oral   Take 1 tablet (325 mg total) by mouth 2 (two) times daily.   60 tablet   0   . cephALEXin (KEFLEX) 500 MG capsule   Oral   Take 1 capsule (500 mg total) by mouth 3 (three) times daily.   12 capsule   0   . methocarbamol  (ROBAXIN) 500 MG tablet   Oral   Take 1 tablet (500 mg total) by mouth every 6 (six) hours as needed for muscle spasms.   50 tablet   2   . oxyCODONE-acetaminophen (PERCOCET) 7.5-325 MG per tablet   Oral   Take 1-2 tablets by mouth every 4 (four) hours as needed for pain.   60 tablet   0    BP 134/86  Pulse 72  Temp(Src) 97.6 F (36.4 C) (Oral)  Resp 18  SpO2 100% Physical Exam  Nursing note and vitals reviewed. Constitutional: He is oriented to person, place, and time. He appears well-developed and well-nourished. No distress.  HENT:  Head: Normocephalic and atraumatic.  Mouth/Throat: Oropharynx is clear and moist.  Eyes: EOM are normal. Pupils are equal, round, and reactive to light.  Neck: Normal range of motion. Neck supple.  No posterior cervical spine tenderness to palpation.  Cardiovascular: Normal rate and regular rhythm.   Pulmonary/Chest: Effort normal and breath sounds normal. No respiratory distress. He has no wheezes. He has no rales.  Abdominal: Soft. Bowel sounds are normal. He exhibits no distension and no mass. There is no tenderness. There is no rebound and no guarding.  Musculoskeletal:  Normal range of motion. He exhibits no edema and no tenderness.  Right anterior knee swelling with tenderness to palpation. Appears stable. Patient is unable to extend the right knee. No calf swelling or tenderness. The posterior knee tenderness. 2+ dorsalis pedis pulses.  Neurological: He is alert and oriented to person, place, and time.  5/5 right hallucis longus extension/flexion. Sensation is intact. Patient is ambulatory   Skin: Skin is warm and dry. No rash noted. No erythema.  Psychiatric: He has a normal mood and affect. His behavior is normal.    ED Course  Procedures (including critical care time) Labs Review Labs Reviewed - No data to display Imaging Review No results found.  EKG Interpretation   None       MDM   1. Patellar fracture, right, closed,  initial encounter   2. Closed fracture of right patella    Discussed with orthopedics who advised knee immobilizer and followup in the office the following day. Return precautions have been given    Loren Racer, MD 09/20/13 [phone]

## 2013-10-16 ENCOUNTER — Encounter (HOSPITAL_COMMUNITY): Payer: Self-pay | Admitting: Specialist

## 2013-12-12 ENCOUNTER — Other Ambulatory Visit: Payer: Self-pay | Admitting: Specialist

## 2013-12-12 DIAGNOSIS — S82009A Unspecified fracture of unspecified patella, initial encounter for closed fracture: Secondary | ICD-10-CM

## 2013-12-15 ENCOUNTER — Ambulatory Visit
Admission: RE | Admit: 2013-12-15 | Discharge: 2013-12-15 | Disposition: A | Discharge status: Home / Self Care | Payer: BC Managed Care – PPO | Source: Ambulatory Visit | Attending: Specialist | Admitting: Specialist

## 2013-12-15 DIAGNOSIS — S82009A Unspecified fracture of unspecified patella, initial encounter for closed fracture: Secondary | ICD-10-CM

## 2015-03-13 IMAGING — CT CT KNEE*R* W/O CM
3 series · 16 of 33 positions shown, 19 images · non-contrast
Comparison: 09/15/2013.

CLINICAL DATA: Preoperative CT. Hardware removal. Evaluate healing
status post patellar fracture.

EXAM:
CT OF THE RIGHT KNEE WITHOUT CONTRAST
TECHNIQUE: Multidetector CT imaging was performed according to the standard
protocol. Multiplanar CT image reconstructions were also generated.

[Series 5: knee soft · axial · 0.39mm/px · z∈[-97,+73]mm · 8 of 82 slices shown, 10 images]
[im 7/82  soft-tissue]
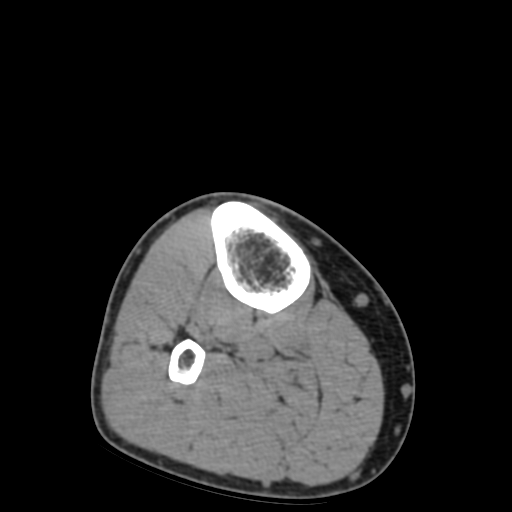
[im 7/82  bone]
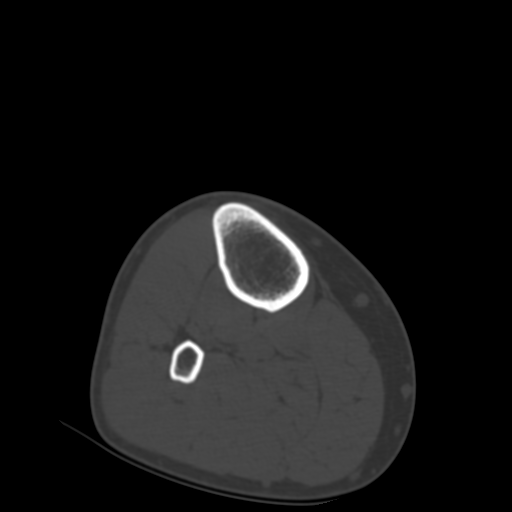
[im 19/82  bone]
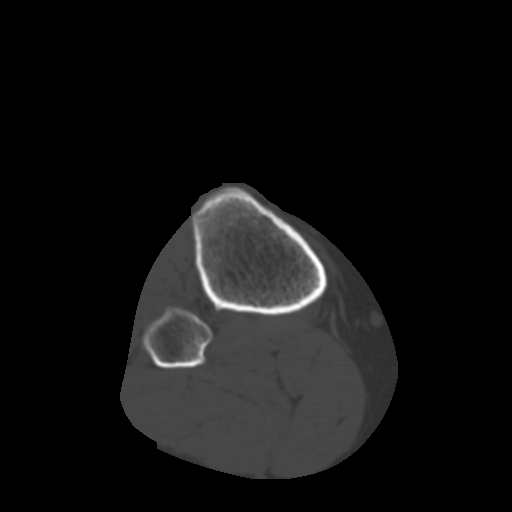
[im 25/82  bone]
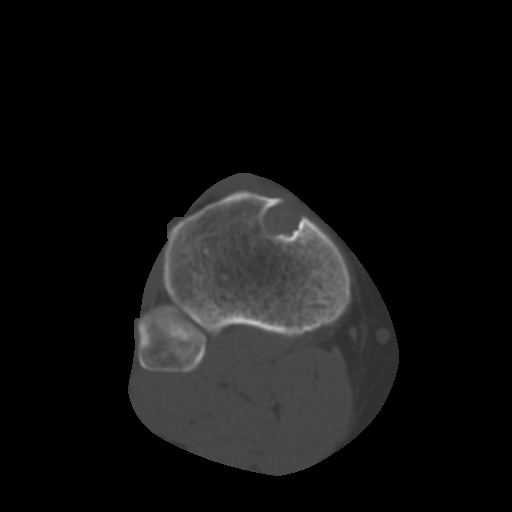
[im 38/82  bone]
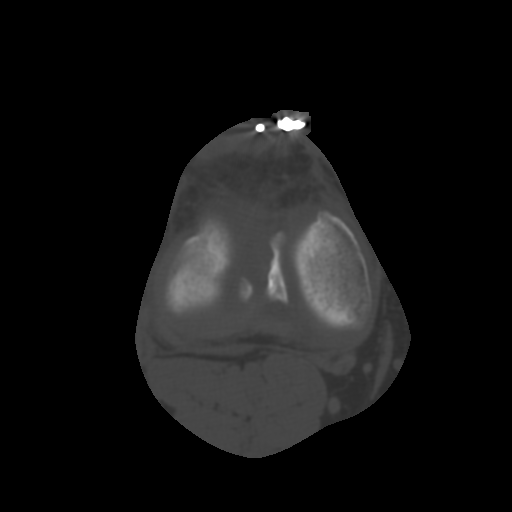
[im 44/82  soft-tissue]
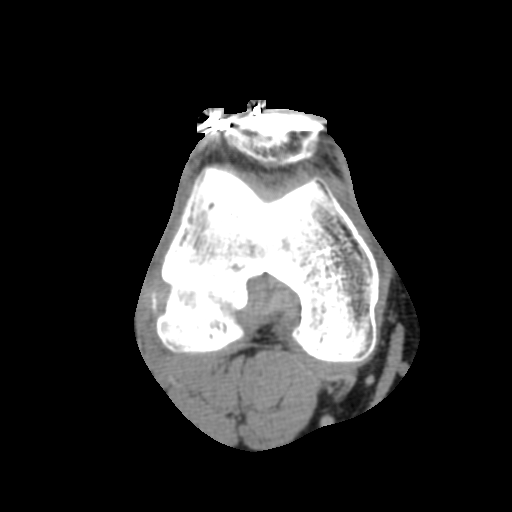
[im 44/82  bone]
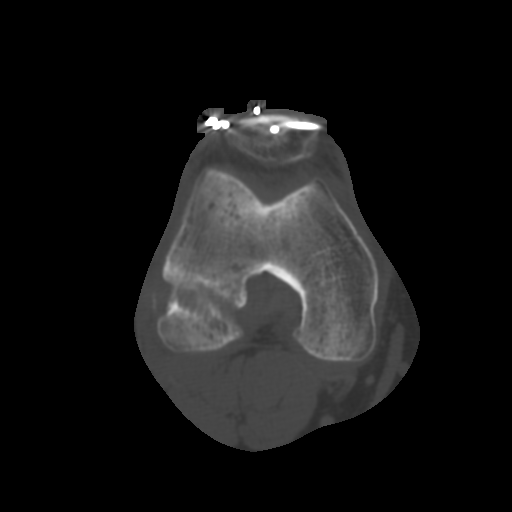
[im 57/82  bone]
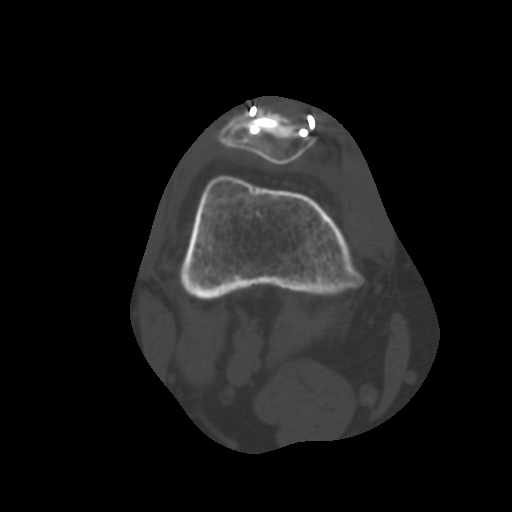
[im 63/82  bone]
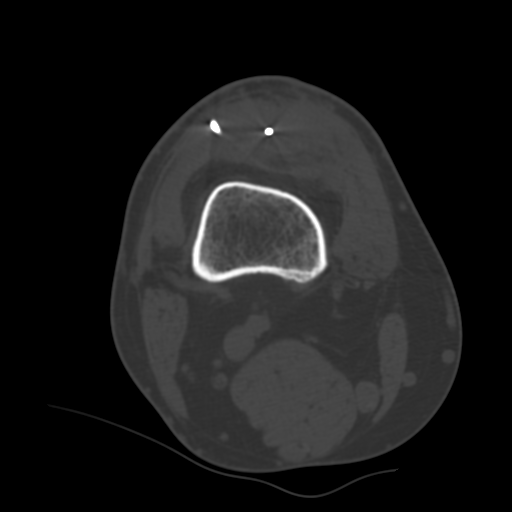
[im 75/82  bone]
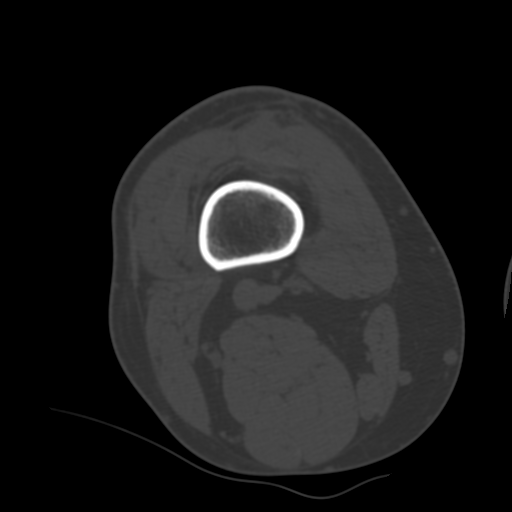

[Series 300: cor soft · coronal · 0.41mm/px · 3 of 86 slices shown]
[im 18/86  bone]
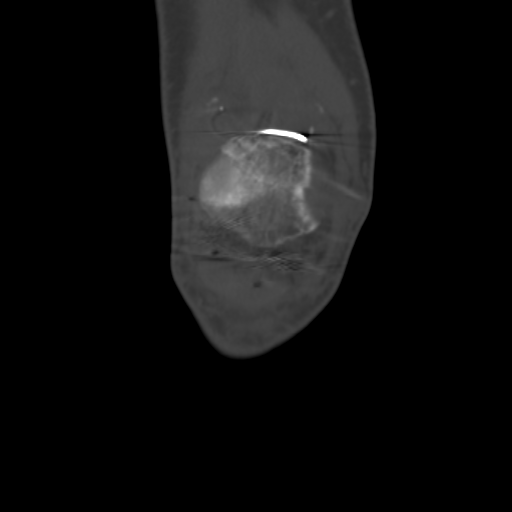
[im 35/86  bone]
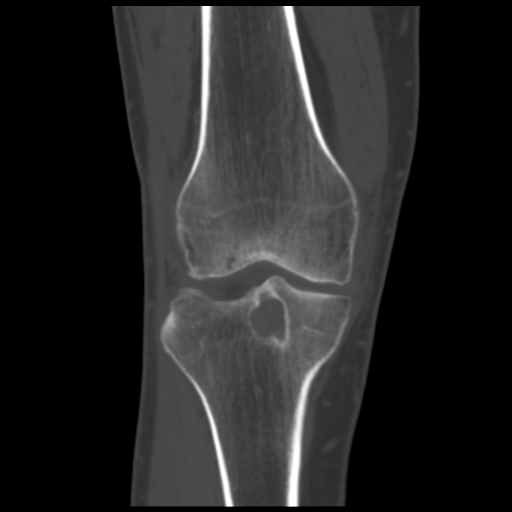
[im 52/86  bone]
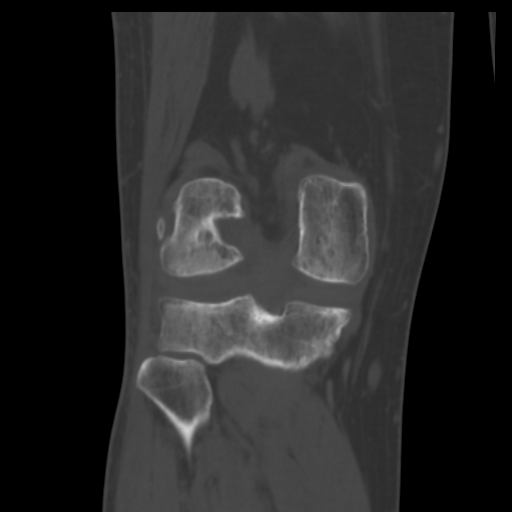

[Series 301: sag soft · sagittal · 0.41mm/px · 5 of 74 slices shown, 6 images]
[im 25/74  bone]
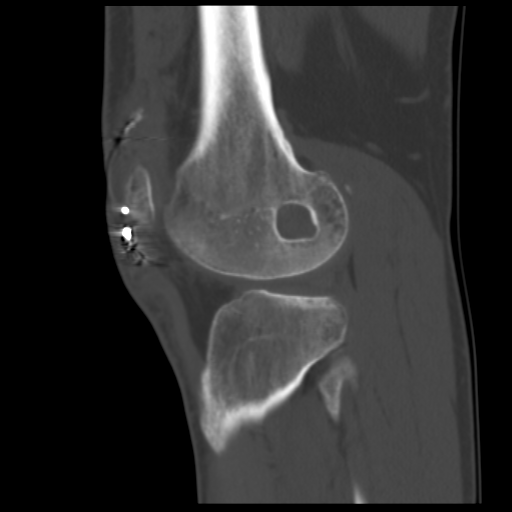
[im 31/74  bone]
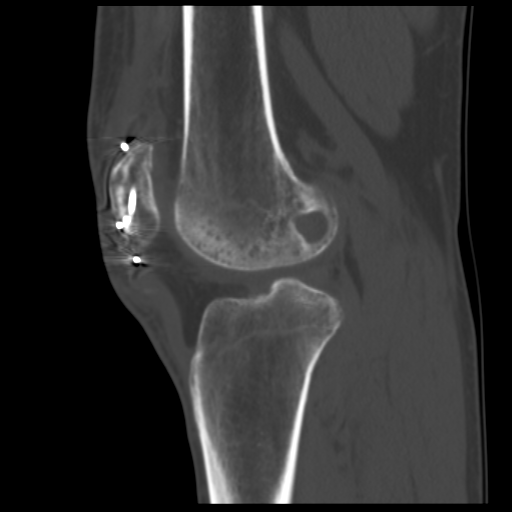
[im 37/74  soft-tissue]
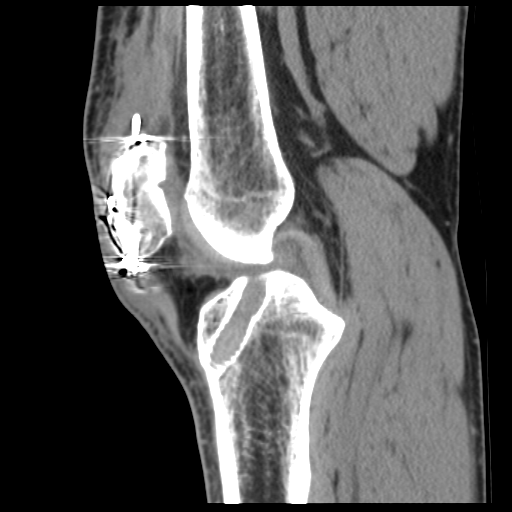
[im 37/74  bone]
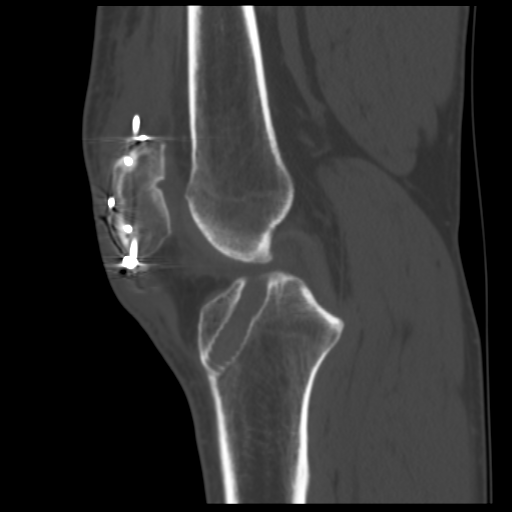
[im 43/74  bone]
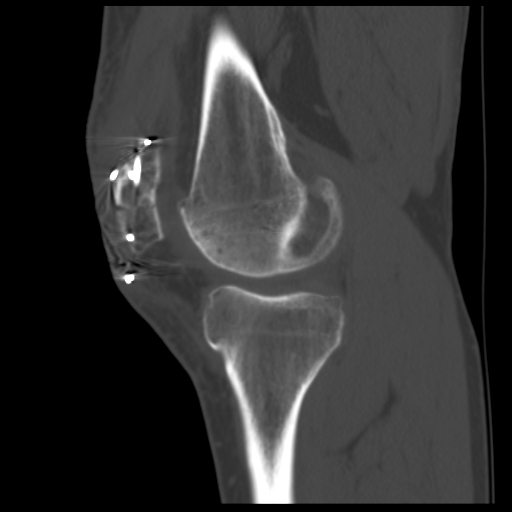
[im 49/74  bone]
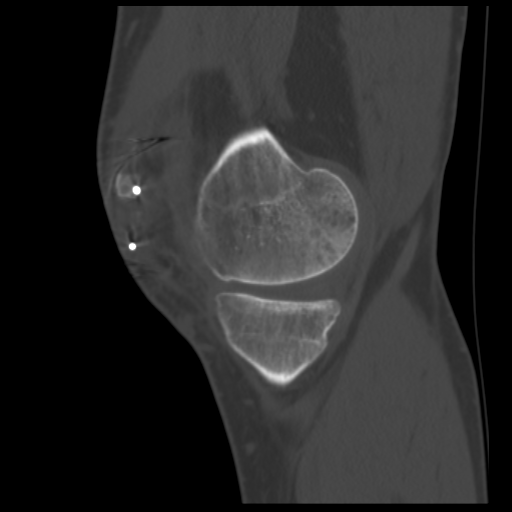

[16 of 33 positions shown; findings below may reference images not displayed]

FINDINGS: K-wire fixation of the comminuted patella fracture is present along
with tension band. The fracture is healed. There is cortical
irregularity of the patellar articular surface associated with a
healed fracture. Prepatellar soft tissue swelling is present.
Grossly the quadriceps and patellar tendons are within normal
limits.

Postoperative changes of ACL reconstruction are present.
Heterogeneous mineralization of the lateral femoral condyle is
present, likely representing disuse osteopenia. Similar changes
although less pronounced are present in the medial femoral condyle.
Medial and lateral compartment joint spaces appear preserved.
Popliteal fossa appears normal.
IMPRESSION: 1. Healed comminuted patellar fracture with K-wire and tension band
fixation. Mild cortical irregularity of the mid portion of the
patellar articular surface.
2. Tibial and femoral tunnels compatible with ACL reconstruction.

## 2015-06-19 ENCOUNTER — Encounter (HOSPITAL_COMMUNITY): Payer: Self-pay | Admitting: Family Medicine

## 2015-06-19 ENCOUNTER — Emergency Department (HOSPITAL_COMMUNITY)
Admission: EM | Admit: 2015-06-19 | Discharge: 2015-06-19 | Disposition: A | Discharge status: Home / Self Care | Payer: BLUE CROSS/BLUE SHIELD | Attending: Physician Assistant | Admitting: Physician Assistant

## 2015-06-19 DIAGNOSIS — Z792 Long term (current) use of antibiotics: Secondary | ICD-10-CM | POA: Diagnosis not present

## 2015-06-19 DIAGNOSIS — Z872 Personal history of diseases of the skin and subcutaneous tissue: Secondary | ICD-10-CM | POA: Insufficient documentation

## 2015-06-19 DIAGNOSIS — Z87891 Personal history of nicotine dependence: Secondary | ICD-10-CM | POA: Insufficient documentation

## 2015-06-19 DIAGNOSIS — Z7982 Long term (current) use of aspirin: Secondary | ICD-10-CM | POA: Insufficient documentation

## 2015-06-19 DIAGNOSIS — Z8659 Personal history of other mental and behavioral disorders: Secondary | ICD-10-CM | POA: Insufficient documentation

## 2015-06-19 DIAGNOSIS — K6289 Other specified diseases of anus and rectum: Secondary | ICD-10-CM | POA: Diagnosis present

## 2015-06-19 DIAGNOSIS — K645 Perianal venous thrombosis: Secondary | ICD-10-CM

## 2015-06-19 DIAGNOSIS — K644 Residual hemorrhoidal skin tags: Secondary | ICD-10-CM | POA: Diagnosis not present

## 2015-06-19 MED ORDER — OXYCODONE-ACETAMINOPHEN 5-325 MG PO TABS
ORAL_TABLET | ORAL | Status: AC
  Filled 2015-06-19: qty 1

## 2015-06-19 MED ORDER — OXYCODONE-ACETAMINOPHEN 5-325 MG PO TABS
1.0000 | ORAL_TABLET | ORAL | Status: DC | PRN
Start: 2015-06-19 — End: 2022-10-09

## 2015-06-19 MED ORDER — LIDOCAINE HCL 2 % EX GEL
1.0000 "application " | Freq: Once | CUTANEOUS | Status: AC
  Administered 2015-06-19: 1 via TOPICAL
  Filled 2015-06-19: qty 20

## 2015-06-19 MED ORDER — OXYCODONE-ACETAMINOPHEN 5-325 MG PO TABS
1.0000 | ORAL_TABLET | Freq: Once | ORAL | Status: AC
  Administered 2015-06-19: 1 via ORAL

## 2015-06-19 MED ORDER — HYDROCORTISONE ACE-PRAMOXINE 1-1 % RE FOAM
1.0000 | Freq: Two times a day (BID) | RECTAL | Status: DC
  Administered 2015-06-19: 1 via RECTAL
  Filled 2015-06-19: qty 10

## 2015-06-19 NOTE — ED Provider Notes (Signed)
CSN: [id number]     Arrival date & time 06/19/15  1821 History  By signing my name below, I, [name], attest that this documentation has been prepared under the direction and in the presence of Kerrie Buffalo, NP. Electronically Signed: [name], ED Scribe. 06/19/2015. 7:52 PM.  Chief Complaint  Patient presents with  . Hemorrhoids   The history is provided by the patient. No language interpreter was used.     HPI Comments: Theodore Sanchez is a 39 y.o. male who presents to the Emergency Department complaining of gradual onset, constant, severe, rectal pain x 1 day, gradually worsening. Pt states last night he felt a large hemorrhoid which is causing the pain. He has hx of hemorrhoids in the past with the most recent being approximately 4 years ago. Pt does admit to straining a couple of days ago which he believes caused the hemorrhoid. He was seen by his PCP this morning and was sent to the [place] [facility]. Pt states he was given 4 shots to the hemorrhoid with mild relief. Pt cannot say what he was injected with. Pt was told that the hemorrhoid would go down in size significantly over the night and that he should just place ice on the area. He notes that approximately 10 minutes after leaving the surgery center, the pain came back more severe. The surgery center did not want to lance the hemorrhoid due to worrying about "messing something up.". Pt was also not given pain medication due to worrying about possible constipation which could aggravate the hemorrhoid. He also complains of mild nausea from the pain. Denies abdominal pain, fever, chills, vomiting, or any other associated symptoms.    Past Medical History  Diagnosis Date  . Alopecia   . Anxiety    Past Surgical History  Procedure Laterality Date  . Anterior cruciate ligament repair      2009   . Eye surgery      knife in eye surgery- left eye   . Eye surgery      right eye- due to dirt in eye   . Dog bite  surgery       age 63 - plastic surgery on face   . Orif patella Right 09/19/2013    Procedure: OPEN REDUCTION INTERNAL (ORIF) FIXATION RIGHT KNEE PATELLA;  Surgeon: Eugenia Mcalpine, MD;  Location: [facility];  Service: Orthopedics;  Laterality: Right;   History reviewed. No pertinent family history. Social History  Substance Use Topics  . Smoking status: Former Games developer  . Smokeless tobacco: Current User    Types: Snuff  . Alcohol Use: No     Comment: hx of in 20s     Review of Systems  Constitutional: Negative for fever and chills.  Gastrointestinal: Positive for rectal pain. Negative for nausea, vomiting and abdominal pain.  All other systems reviewed and are negative.  Allergies  Review of patient's allergies indicates no known allergies.  Home Medications   Prior to Admission medications   Medication Sig Start Date End Date Taking? Authorizing Provider  aspirin EC 325 MG tablet Take 1 tablet (325 mg total) by mouth 2 (two) times daily. 09/19/13   Bryson L Stilwell, PA-C  cephALEXin (KEFLEX) 500 MG capsule Take 1 capsule (500 mg total) by mouth 3 (three) times daily. 09/19/13   Bryson L Stilwell, PA-C  methocarbamol (ROBAXIN) 500 MG tablet Take 1 tablet (500 mg total) by mouth every 6 (six) hours as needed for muscle spasms. 09/19/13  Bryson L Stilwell, PA-C  oxyCODONE-acetaminophen (ROXICET) 5-325 MG tablet Take 1 tablet by mouth every 4 (four) hours as needed for severe pain. 06/19/15   Chaunice Obie Orlene OchM Adarian Bur, NP   Triage Vitals: BP 138/86 mmHg  Pulse 88  Temp(Src) 97.7 F (36.5 C) (Oral)  Resp 18  SpO2 97%   Physical Exam  Constitutional: He is oriented to person, place, and time. He appears well-developed and well-nourished.  HENT:  Head: Normocephalic and atraumatic.  Eyes: Conjunctivae and EOM are normal.  Neck: Normal range of motion. Neck supple.  Cardiovascular: Normal rate.   Pulmonary/Chest: Effort normal.  Abdominal: He exhibits no distension.  Genitourinary:  Large  thrombosed external hemorrhoid tender to palpation  Musculoskeletal: Normal range of motion.  Neurological: He is alert and oriented to person, place, and time.  Skin: Skin is warm and dry.  Psychiatric: He has a normal mood and affect. His behavior is normal.  Nursing note and vitals reviewed.   ED Course  Procedures (including critical care time) Percocet given for pain in triage  Topical lidocaine gel applied to the area Proctofoam   DIAGNOSTIC STUDIES: Oxygen Saturation is 97% on RA, normal by my interpretation.    COORDINATION OF CARE: 7:40 PM-Discussed treatment plan which includes pain medication with pt at bedside and pt agreed to plan.   After medications the patient has decreased pain that he now rates 5/10 and feels he can tolerate until he can return to the surgeon in the morning.  I discussed this case with Dr. Corlis LeakMackuen MDM  39 y.o. male with pain and swelling due to thrombosed hemorrhoid. Stable for d/c to follow up with his surgeon. He will return as needed for any problems.  Final diagnoses:  Thrombosed external hemorrhoid   I personally performed the services described in this documentation, which was scribed in my presence. The recorded information has been reviewed and is accurate.     [facility] Orlene OchM Oluchi Pucci, NP 06/21/15 1444  Courteney Randall AnLyn Mackuen, MD 06/22/15 [phone]

## 2015-06-19 NOTE — ED Notes (Signed)
Pt here for a hemorrhoid that started today. sts very large and external. sts he went to [facility] and they shot the hemorrhoid with something. sts severe pain and not better.

## 2015-06-19 NOTE — Discharge Instructions (Signed)
Call your doctor at [facility] in the morning for follow up. Be sure you are taking the stool softener.   Hemorrhoids Hemorrhoids are swollen veins around the rectum or anus. There are two types of hemorrhoids:   Internal hemorrhoids. These occur in the veins just inside the rectum. They may poke through to the outside and become irritated and painful.  External hemorrhoids. These occur in the veins outside the anus and can be felt as a painful swelling or hard lump near the anus. CAUSES  Pregnancy.   Obesity.   Constipation or diarrhea.   Straining to have a bowel movement.   Sitting for long periods on the toilet.  Heavy lifting or other activity that caused you to strain.  Anal intercourse. SYMPTOMS   Pain.   Anal itching or irritation.   Rectal bleeding.   Fecal leakage.   Anal swelling.   One or more lumps around the anus.  DIAGNOSIS  Your caregiver may be able to diagnose hemorrhoids by visual examination. Other examinations or tests that may be performed include:   Examination of the rectal area with a gloved hand (digital rectal exam).   Examination of anal canal using a small tube (scope).   A blood test if you have lost a significant amount of blood.  A test to look inside the colon (sigmoidoscopy or colonoscopy). TREATMENT Most hemorrhoids can be treated at home. However, if symptoms do not seem to be getting better or if you have a lot of rectal bleeding, your caregiver may perform a procedure to help make the hemorrhoids get smaller or remove them completely. Possible treatments include:   Placing a rubber band at the base of the hemorrhoid to cut off the circulation (rubber band ligation).   Injecting a chemical to shrink the hemorrhoid (sclerotherapy).   Using a tool to burn the hemorrhoid (infrared light therapy).   Surgically removing the hemorrhoid (hemorrhoidectomy).   Stapling the hemorrhoid to block blood flow  to the tissue (hemorrhoid stapling).  HOME CARE INSTRUCTIONS   Eat foods with fiber, such as whole grains, beans, nuts, fruits, and vegetables. Ask your doctor about taking products with added fiber in them (fibersupplements).  Increase fluid intake. Drink enough water and fluids to keep your urine clear or pale yellow.   Exercise regularly.   Go to the bathroom when you have the urge to have a bowel movement. Do not wait.   Avoid straining to have bowel movements.   Keep the anal area dry and clean. Use wet toilet paper or moist towelettes after a bowel movement.   Medicated creams and suppositories may be used or applied as directed.   Only take over-the-counter or prescription medicines as directed by your caregiver.   Take warm sitz baths for 15-20 minutes, 3-4 times a day to ease pain and discomfort.   Place ice packs on the hemorrhoids if they are tender and swollen. Using ice packs between sitz baths may be helpful.   Put ice in a plastic bag.   Place a towel between your skin and the bag.   Leave the ice on for 15-20 minutes, 3-4 times a day.   Do not use a donut-shaped pillow or sit on the toilet for long periods. This increases blood pooling and pain.  SEEK MEDICAL CARE IF:  You have increasing pain and swelling that is not controlled by treatment or medicine.  You have uncontrolled bleeding.  You have difficulty or you are unable to  have a bowel movement.  You have pain or inflammation outside the area of the hemorrhoids. MAKE SURE YOU:  Understand these instructions.  Will watch your condition.  Will get help right away if you are not doing well or get worse.   This information is not intended to replace advice given to you by your health care provider. Make sure you discuss any questions you have with your health care provider.   Document Released: 08/21/2000 Document Revised: 08/10/2012 Document Reviewed: 06/28/2012 Elsevier Interactive  Patient Education 2016 Elsevier Inc.  High-Fiber Diet Fiber, also called dietary fiber, is a type of carbohydrate found in fruits, vegetables, whole grains, and beans. A high-fiber diet can have many health benefits. Your health care provider may recommend a high-fiber diet to help:  Prevent constipation. Fiber can make your bowel movements more regular.  Lower your cholesterol.  Relieve hemorrhoids, uncomplicated diverticulosis, or irritable bowel syndrome.  Prevent overeating as part of a weight-loss plan.  Prevent heart disease, type 2 diabetes, and certain cancers. WHAT IS MY PLAN? The recommended daily intake of fiber includes:  38 grams for men under age 37.  30 grams for men over age 37.  25 grams for women under age 37.  21 grams for women over age 37. You can get the recommended daily intake of dietary fiber by eating a variety of fruits, vegetables, grains, and beans. Your health care provider may also recommend a fiber supplement if it is not possible to get enough fiber through your diet. WHAT DO I NEED TO KNOW ABOUT A HIGH-FIBER DIET?  Fiber supplements have not been widely studied for their effectiveness, so it is better to get fiber through food sources.  Always check the fiber content on thenutrition facts label of any prepackaged food. Look for foods that contain at least 5 grams of fiber per serving.  Ask your dietitian if you have questions about specific foods that are related to your condition, especially if those foods are not listed in the following section.  Increase your daily fiber consumption gradually. Increasing your intake of dietary fiber too quickly may cause bloating, cramping, or gas.  Drink plenty of water. Water helps you to digest fiber. WHAT FOODS CAN I EAT? Grains Whole-grain breads. Multigrain cereal. Oats and oatmeal. Brown rice. Barley. Bulgur wheat. Millet. Bran muffins. Popcorn. Rye wafer crackers. Vegetables Sweet potatoes.  Spinach. Kale. Artichokes. Cabbage. Broccoli. Green peas. Carrots. Squash. Fruits Berries. Pears. Apples. Oranges. Avocados. Prunes and raisins. Dried figs. Meats and Other Protein Sources Navy, kidney, pinto, and soy beans. Split peas. Lentils. Nuts and seeds. Dairy Fiber-fortified yogurt. Beverages Fiber-fortified soy milk. Fiber-fortified orange juice. Other Fiber bars. The items listed above may not be a complete list of recommended foods or beverages. Contact your dietitian for more options. WHAT FOODS ARE NOT RECOMMENDED? Grains White bread. Pasta made with refined flour. White rice. Vegetables Fried potatoes. Canned vegetables. Well-cooked vegetables.  Fruits Fruit juice. Cooked, strained fruit. Meats and Other Protein Sources Fatty cuts of meat. Fried Environmental education officerpoultry or fried fish. Dairy Milk. Yogurt. Cream cheese. Sour cream. Beverages Soft drinks. Other Cakes and pastries. Butter and oils. The items listed above may not be a complete list of foods and beverages to avoid. Contact your dietitian for more information. WHAT ARE SOME TIPS FOR INCLUDING HIGH-FIBER FOODS IN MY DIET?  Eat a wide variety of high-fiber foods.  Make sure that half of all grains consumed each day are whole grains.  Replace breads and cereals made  from refined flour or white flour with whole-grain breads and cereals.  Replace white rice with brown rice, bulgur wheat, or millet.  Start the day with a breakfast that is high in fiber, such as a cereal that contains at least 5 grams of fiber per serving.  Use beans in place of meat in soups, salads, or pasta.  Eat high-fiber snacks, such as berries, raw vegetables, nuts, or popcorn.   This information is not intended to replace advice given to you by your health care provider. Make sure you discuss any questions you have with your health care provider.   Document Released: 08/24/2005 Document Revised: 09/14/2014 Document Reviewed: 02/06/2014 Elsevier  Interactive Patient Education [employer]

## 2020-02-23 ENCOUNTER — Emergency Department (HOSPITAL_BASED_OUTPATIENT_CLINIC_OR_DEPARTMENT_OTHER): Payer: BLUE CROSS/BLUE SHIELD

## 2020-02-23 ENCOUNTER — Emergency Department (HOSPITAL_BASED_OUTPATIENT_CLINIC_OR_DEPARTMENT_OTHER)
Admission: EM | Admit: 2020-02-23 | Discharge: 2020-02-23 | Disposition: A | Discharge status: Home / Self Care | Payer: BLUE CROSS/BLUE SHIELD | Attending: Emergency Medicine | Admitting: Emergency Medicine

## 2020-02-23 ENCOUNTER — Other Ambulatory Visit: Payer: Self-pay

## 2020-02-23 ENCOUNTER — Encounter (HOSPITAL_BASED_OUTPATIENT_CLINIC_OR_DEPARTMENT_OTHER): Payer: Self-pay | Admitting: Emergency Medicine

## 2020-02-23 DIAGNOSIS — N50812 Left testicular pain: Secondary | ICD-10-CM | POA: Insufficient documentation

## 2020-02-23 LAB — URINALYSIS, ROUTINE W REFLEX MICROSCOPIC
Bilirubin Urine: NEGATIVE
Glucose, UA: NEGATIVE mg/dL
Hgb urine dipstick: NEGATIVE
Ketones, ur: NEGATIVE mg/dL
Leukocytes,Ua: NEGATIVE
Nitrite: NEGATIVE
Protein, ur: NEGATIVE mg/dL
Specific Gravity, Urine: 1.02 (ref 1.005–1.030)
pH: 7 (ref 5.0–8.0)

## 2020-02-23 NOTE — ED Notes (Signed)
Patient transported to Ultrasound 

## 2020-02-23 NOTE — ED Triage Notes (Signed)
Pt states he is having discomfort in his testicle area  Pt states it started on Sunday   Pt states he has a hx of a testicular cyst  Denies swelling

## 2020-02-23 NOTE — ED Provider Notes (Signed)
[facility] EMERGENCY DEPARTMENT Provider Note   CSN: [id number] Arrival date & time: 02/23/20  1912     History Chief Complaint  Patient presents with  . Testicle Pain    Theodore Sanchez is a 44 y.o. male.  HPI   Patient presents to the emergency department with chief complaint of testicle pain that has been going on for the last 5 days.  Patient explains that he has has chronic testicular pain but over the last 5 days the pain has been worse.  He denies any discharge, urinary symptoms, noticed any rashes or lesions on his genitalia. Patient explains the pain is episodic and nothing seems to make it better or worse.  Patient denies any recent trauma to the area.  Patient explains that he has had a cyst on his left testicle which was diagnosed 5 years ago but denies any imaging or further follow-up.  Patient denies fever, night sweats, throat pain, cough, chest pain, shortness of breath, abdominal pain, urinary symptoms, diarrhea, leg pain.  Patient has no other similar medical history and does not take any medication on daily basis.  Past Medical History:  Diagnosis Date  . Alopecia   . Anxiety     Patient Active Problem List   Diagnosis Date Noted  . FH: hypothyroidism 12/13/2012  . Alopecia   . Anxiety     Past Surgical History:  Procedure Laterality Date  . ANTERIOR CRUCIATE LIGAMENT REPAIR     2009   . dog bite surgery      age 44 - plastic surgery on face   . EYE SURGERY     knife in eye surgery- left eye   . EYE SURGERY     right eye- due to dirt in eye   . ORIF PATELLA Right 09/19/2013   Procedure: OPEN REDUCTION INTERNAL (ORIF) FIXATION RIGHT KNEE PATELLA;  Surgeon: Eugenia Mcalpine, MD;  Location: [facility];  Service: Orthopedics;  Laterality: Right;       Family History  Problem Relation Age of Onset  . Cancer Mother   . Cancer Father   . CAD Other     Social History   Tobacco Use  . Smoking status: Former Games developer  . Smokeless tobacco: Current  User    Types: Snuff, Chew  Vaping Use  . Vaping Use: Never used  Substance Use Topics  . Alcohol use: No    Comment: hx of in 20s   . Drug use: Not Currently    Types: Marijuana    Comment: xanax prn lsat used 09/14/13 per patient     Home Medications Prior to Admission medications   Medication Sig Start Date End Date Taking? Authorizing Provider  aspirin EC 325 MG tablet Take 1 tablet (325 mg total) by mouth 2 (two) times daily. 09/19/13   Stilwell, Bryson L, PA-C  cephALEXin (KEFLEX) 500 MG capsule Take 1 capsule (500 mg total) by mouth 3 (three) times daily. 09/19/13   Stilwell, Herbie Baltimore, PA-C  methocarbamol (ROBAXIN) 500 MG tablet Take 1 tablet (500 mg total) by mouth every 6 (six) hours as needed for muscle spasms. 09/19/13   Stilwell, Herbie Baltimore, PA-C  oxyCODONE-acetaminophen (ROXICET) 5-325 MG tablet Take 1 tablet by mouth every 4 (four) hours as needed for severe pain. 06/19/15   Janne Napoleon, NP    Allergies    Patient has no known allergies.  Review of Systems   Review of Systems  Constitutional: Negative for chills and fever.  HENT: Negative for congestion and sore throat.   Eyes: Negative for pain.  Respiratory: Negative for cough and shortness of breath.   Cardiovascular: Negative for chest pain.  Gastrointestinal: Negative for abdominal pain, diarrhea, nausea and vomiting.  Genitourinary: Positive for testicular pain. Negative for discharge, enuresis, flank pain, hematuria, penile pain, penile swelling and scrotal swelling.  Musculoskeletal: Negative for back pain.  Skin: Negative for rash.  Neurological: Negative for dizziness.  Hematological: Does not bruise/bleed easily.    Physical Exam Updated Vital Signs BP 115/84 (BP Location: Right Arm)   Pulse (!) 54   Temp 98.2 F (36.8 C) (Oral)   Resp 18   Ht 5' 11.75" (1.822 m)   Wt 93.4 kg   SpO2 100%   BMI 28.13 kg/m   Physical Exam Vitals and nursing note reviewed. Exam conducted with a chaperone present.    Constitutional:      General: He is not in acute distress.    Appearance: He is not ill-appearing.  HENT:     Head: Normocephalic and atraumatic.     Nose: No congestion.     Mouth/Throat:     Mouth: Mucous membranes are moist.     Pharynx: Oropharynx is clear.  Eyes:     General: No scleral icterus. Cardiovascular:     Rate and Rhythm: Normal rate and regular rhythm.     Pulses: Normal pulses.     Heart sounds: No murmur heard.  No friction rub. No gallop.   Pulmonary:     Effort: No respiratory distress.     Breath sounds: No wheezing, rhonchi or rales.  Abdominal:     General: There is no distension.     Palpations: There is no mass.     Tenderness: There is no abdominal tenderness. There is no guarding.     Hernia: No hernia is present.  Genitourinary:    Comments: Patient's genitalia was examined.  There was no lesions, abrasions, rashes or other gross abnormalities noted.  Both testicles were symmetrical, no bell clap deformity noted.  Testicles were nontender to palpation, no masses were felt and there was there is no discharge noted coming from the urethra.  No signs of a inguinal hernia  Musculoskeletal:        General: No swelling.  Skin:    General: Skin is warm and dry.     Findings: No rash.  Neurological:     Mental Status: He is alert.  Psychiatric:        Mood and Affect: Mood normal.     ED Results / Procedures / Treatments   Labs (all labs ordered are listed, but only abnormal results are displayed) Labs Reviewed  URINALYSIS, ROUTINE W REFLEX MICROSCOPIC    EKG None  Radiology US SCROTUM W/DOPPLER  Result Date: 02/23/2020 CLINICAL DATA:  Testicular discomfort for 5 days, history of cysts EXAM: SCROTAL ULTRASOUND DOPPLER ULTRASOUND OF THE TESTICLES TECHNIQUE: Complete ultrasound examination of the testicles, epididymis, and other scrotal structures was performed. Color and spectral Doppler ultrasound were also utilized to evaluate blood flow to  the testicles. COMPARISON:  None. FINDINGS: Right testicle Measurements: 4.8 x 3.4 x 2.8 cm. No mass or microlithiasis visualized. Left testicle Measurements: 4.8 x 3.2 x 2.7 cm. There are 2 small punctate shadowing calcifications in the testicle could reflect limited testicular microlithiasis or sequela of prior infection or trauma. No concerning testicular mass. Right epididymis:  Normal in size and appearance. Left epididymis: Multiple anechoic simple appearing epididymal  head cysts, the first measuring 1.1 x 1.0 x 1.7 cm, the second 1.4 x 1.6 x 2.2 cm. Left epididymis is otherwise unremarkable. Hydrocele: Small volume right hydrocele with anechoic fluid. Small left-sided hydrocele as well with some low level internal echoes, nonspecific. Varicocele:  None visualized. Pulsed Doppler interrogation of both testes demonstrates normal low resistance arterial and venous waveforms bilaterally. IMPRESSION: No worrisome testicular mass or evidence of torsion. No convincing sonographic evidence of epididymitis or orchitis. Punctate calcifications in the left testis, could reflect sequela of prior infection or trauma versus limited testicular microlithiasis. Current literature suggests that testicular microlithiasis is not a significant independent risk factor for development of testicular carcinoma, and that follow up imaging is not warranted in the absence of other risk factors. Monthly testicular self-examination and annual physical exams are considered appropriate surveillance. If patient has other risk factors for testicular carcinoma, then referral to Urology should be considered. (Reference: DeCastro, et al.: A 5-Year Follow up Study of Asymptomatic Men with Testicular Microlithiasis. J Urol 2008; 778:2423-5361.) Few anechoic left epididymal head cysts, may correlate reported history. Low level internal echoes within a small left hydrocele are nonspecific in the absence of infectious symptoms. Can be seen with  chronicity. Small anechoic right hydrocele as well. Electronically Signed   By: Lovena Le M.D.   On: 02/23/2020 21:19    Procedures Procedures (including critical care time)  Medications Ordered in ED Medications - No data to display  ED Course  I have reviewed the triage vital signs and the nursing notes.  Pertinent labs & imaging results that were available during my care of the patient were reviewed by me and considered in my medical decision making (see chart for details).    MDM Rules/Calculators/A&P                          I have personally reviewed all imaging, labs and have interpreted them.  Due to patient's presentation most concerned for testicular torsion versus UTI versus malignancy versus hernia.  Unlikely that patient's symptom is result of a UTI as UA did not show any abnormalities, there is no ketones, leukocytes noted, patient also denies urinary symptoms and he is afebrile nontachycardic.  Patient had a US Doppler of scrotum did not show any acute abnormalities, no signs of torsion, mass, epididymitis or orchitis.  There was an incidental finding of a punctuated calcification in the left testicle could reflect prior infection or trauma versus possible testicular carcinoma.  Unlikely patient has testicular carcinoma as he has no risk factors or signs, denies night sweats, fever, back pain, loss of weight.  Will refer him to urologist for further management and evaluation.  Unlikely the patient's pain result of a hernia, there is no hernia felt in the inguinal canal, abdominal exam did not show any ventral hernia.  Patient appears to be resting comfortably in bed, not showing any acute signs distress.  Vital signs have remained stable patient does not meet emergent criteria to be admitted to the hospital.  Likely that patient's pain could be result of the calcification seen on his testicle.  Patient was given referral to a urologist and explained to follow-up.  Patient was  given at home instruction as well as strict return precautions.  Patient discussed with attending who agrees with assessment and plan.  Patient was explained the results and plan, patient verbalized that he understood and agrees with said plan. Final Clinical Impression(s) / ED Diagnoses  Final diagnoses:  Pain in left testicle    Rx / DC Orders ED Discharge Orders    None       Barnie Del 02/23/20 [id number]    Tilden Fossa, MD 02/24/20 1049

## 2020-02-23 NOTE — Discharge Instructions (Addendum)
You have been treated for testicular pain.  You can alternate been taking ibuprofen and Tylenol for pain.  For example you can take ibuprofen wait 6 hours then take Tylenol wait 6 hours and repeat.  I want you to follow-up with your primary doctor for reevaluation I have also given you information for a urologist you may also follow-up with them for further evaluation.  I want you to come back to emergency department if you develop severe testicular pain, uncontrolled nausea, vomiting, difficulty urinating, see blood in your urine, chest pain, shortness of breath or the symptoms require further evaluation.

## 2020-12-31 DIAGNOSIS — K645 Perianal venous thrombosis: Secondary | ICD-10-CM | POA: Insufficient documentation

## 2021-05-21 IMAGING — US US SCROTUM W/ DOPPLER COMPLETE
1 series · 13 of 25 positions shown · non-contrast
Comparison: None.

CLINICAL DATA: Testicular discomfort for 5 days, history of cysts

EXAM:
SCROTAL ULTRASOUND
DOPPLER ULTRASOUND OF THE TESTICLES
TECHNIQUE: Complete ultrasound examination of the testicles, epididymis, and
other scrotal structures was performed. Color and spectral Doppler
ultrasound were also utilized to evaluate blood flow to the
testicles.

[Series 1: us scrotum w/ doppler complete · 13 of 64 slices shown]
[im 1/64]
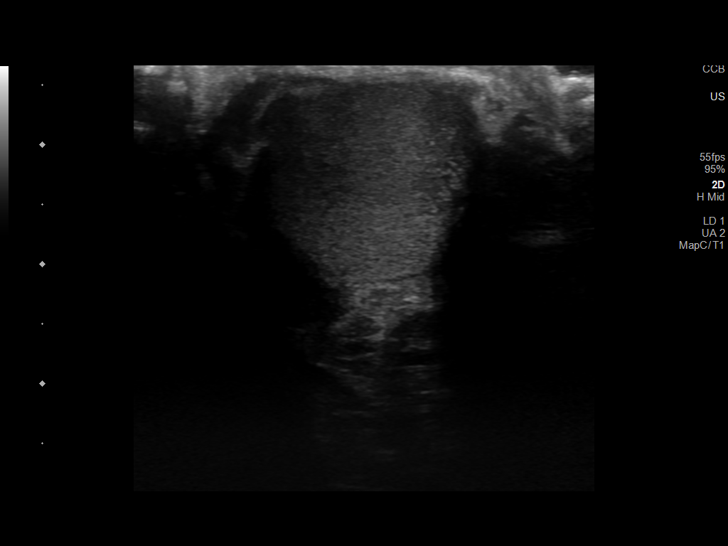
[im 6/64]
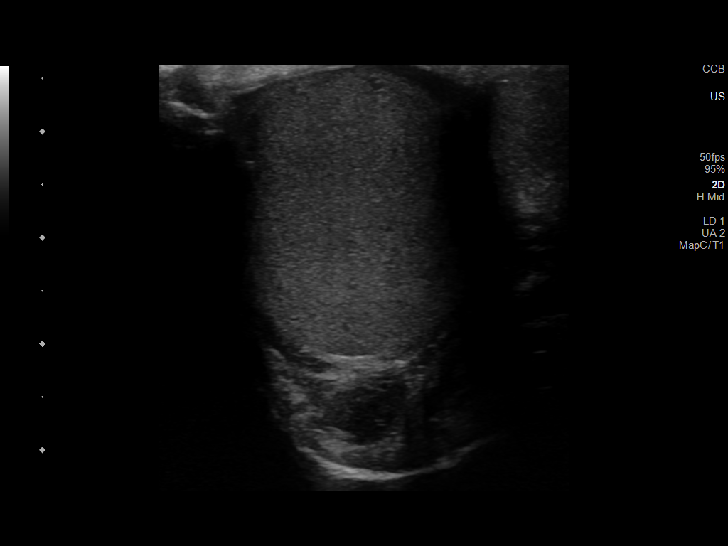
[im 11/64]
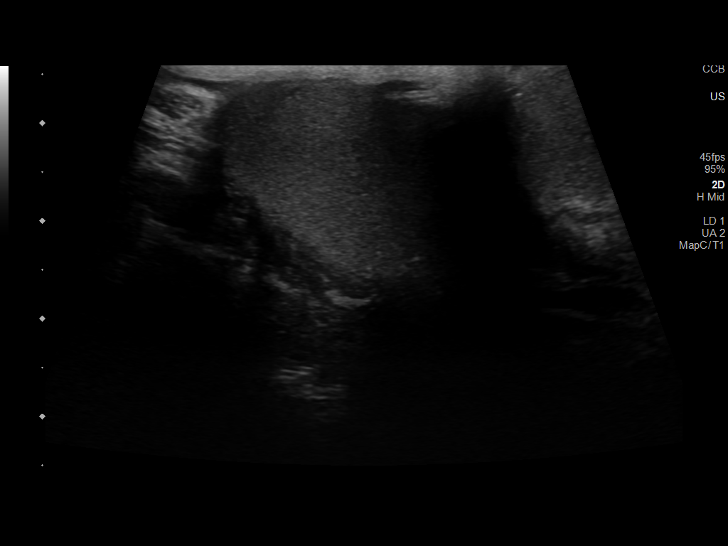
[im 16/64]
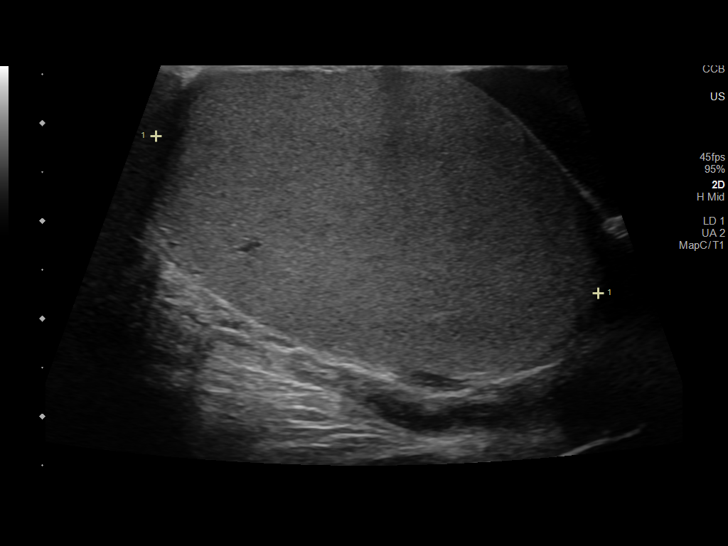
[im 22/64]
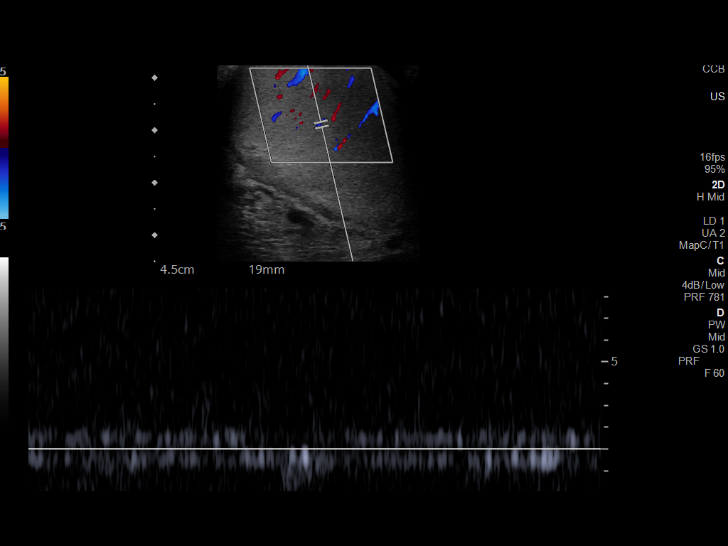
[im 27/64]
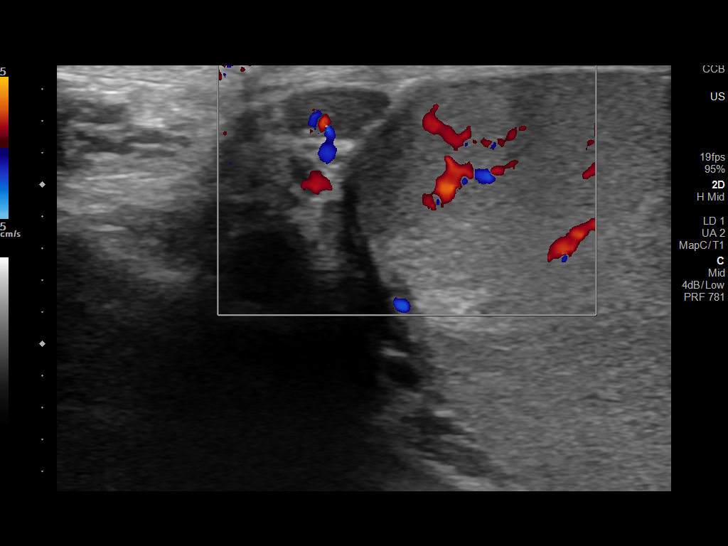
[im 32/64]
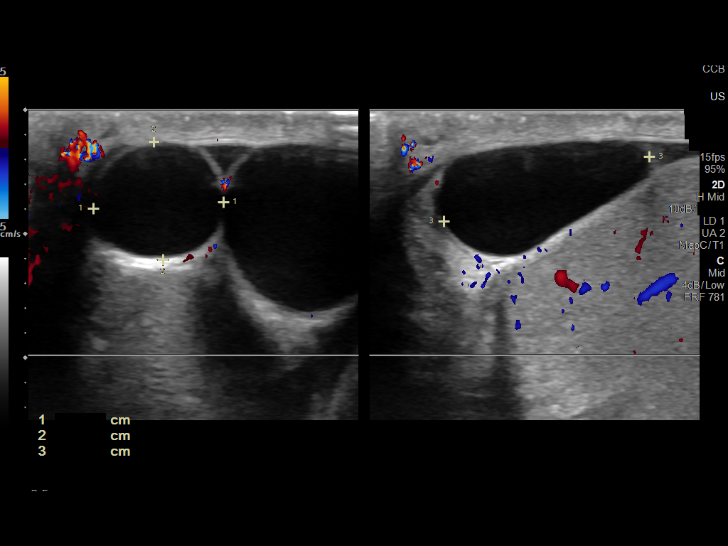
[im 37/64]
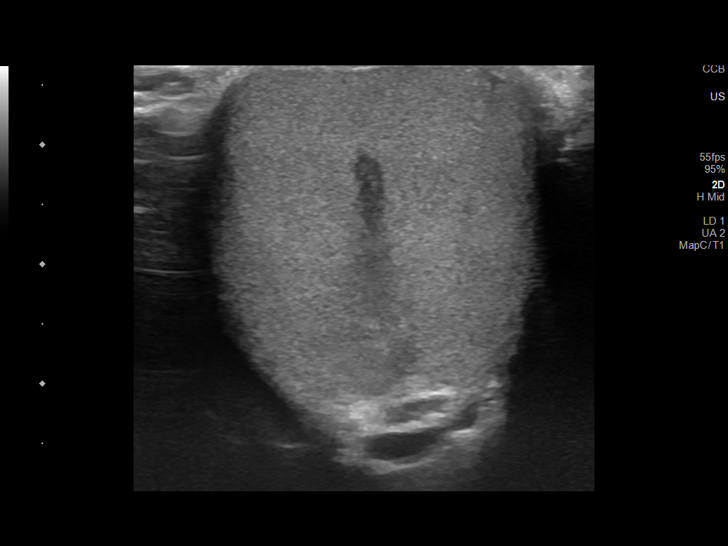
[im 43/64]
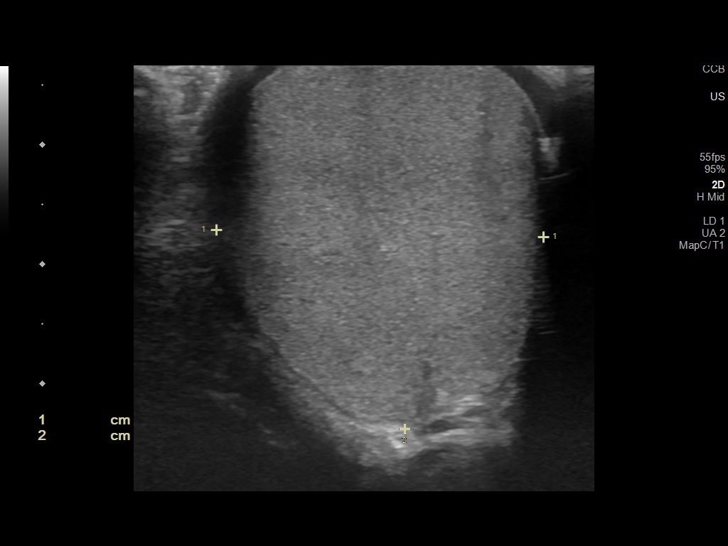
[im 48/64]
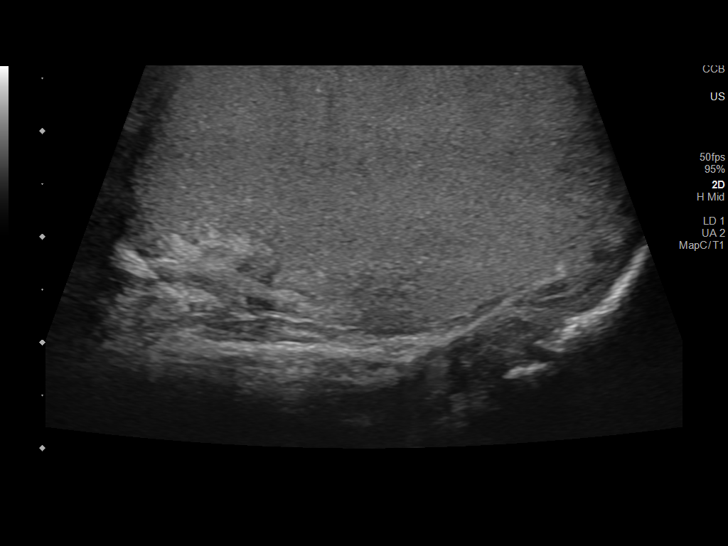
[im 53/64]
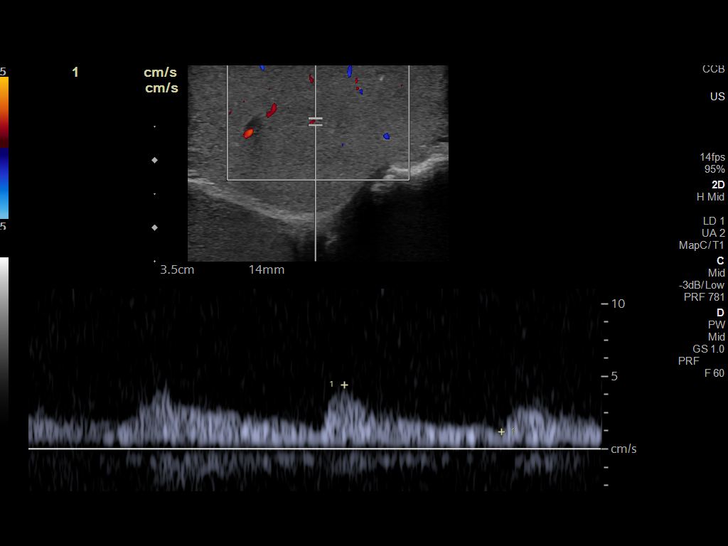
[im 58/64]
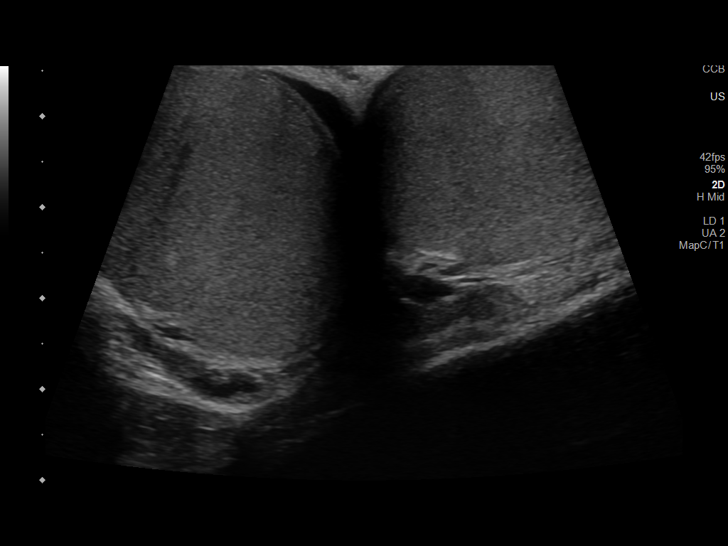
[im 64/64]
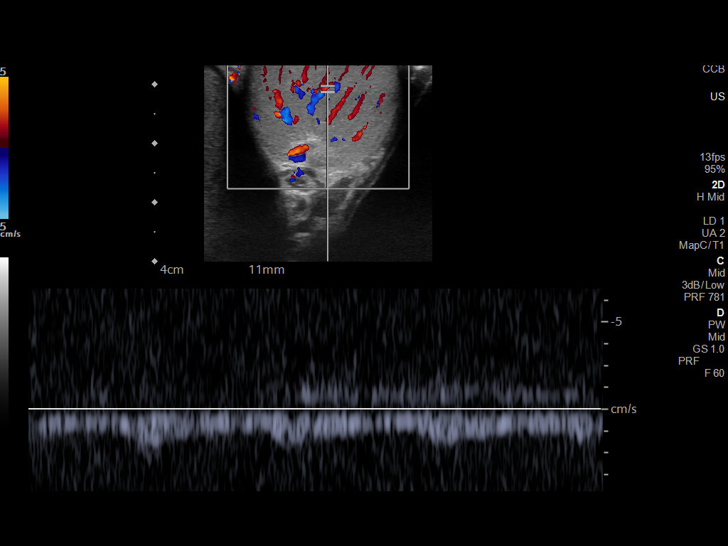

[13 of 25 positions shown; findings below may reference images not displayed]

FINDINGS: Right testicle

Measurements: 4.8 x 3.4 x 2.8 cm. No mass or microlithiasis
visualized.

Left testicle

Measurements: 4.8 x 3.2 x 2.7 cm. There are 2 small punctate
shadowing calcifications in the testicle could reflect limited
testicular microlithiasis or sequela of prior infection or trauma.
No concerning testicular mass.

Right epididymis:  Normal in size and appearance.

Left epididymis: Multiple anechoic simple appearing epididymal head
cysts, the first measuring 1.1 x 1.0 x 1.7 cm, the second 1.4 x
x 2.2 cm. Left epididymis is otherwise unremarkable.

Hydrocele: Small volume right hydrocele with anechoic fluid. Small
left-sided hydrocele as well with some low level internal echoes,
nonspecific.

Varicocele:  None visualized.

Pulsed Doppler interrogation of both testes demonstrates normal low
resistance arterial and venous waveforms bilaterally.
IMPRESSION: No worrisome testicular mass or evidence of torsion. No convincing
sonographic evidence of epididymitis or orchitis.

Punctate calcifications in the left testis, could reflect sequela of
prior infection or trauma versus limited testicular microlithiasis.
Current literature suggests that testicular microlithiasis is not a
significant independent risk factor for development of testicular
carcinoma, and that follow up imaging is not warranted in the
absence of other risk factors. Monthly testicular self-examination
and annual physical exams are considered appropriate surveillance.
If patient has other risk factors for testicular carcinoma, then
referral to Urology should be considered. (Reference: Edwin, et
al.: A 5-Year Follow up Study of Asymptomatic Men with Testicular
Microlithiasis. J Urol 7770; 179:6248-6246.)

Few anechoic left epididymal head cysts, may correlate reported
history.

Low level internal echoes within a small left hydrocele are
nonspecific in the absence of infectious symptoms. Can be seen with
chronicity. Small anechoic right hydrocele as well.

## 2022-10-09 ENCOUNTER — Ambulatory Visit (INDEPENDENT_AMBULATORY_CARE_PROVIDER_SITE_OTHER): Payer: Commercial Managed Care - HMO | Admitting: Internal Medicine

## 2022-10-09 ENCOUNTER — Encounter: Payer: Self-pay | Admitting: Internal Medicine

## 2022-10-09 VITALS — BP 114/67 | HR 64 | Temp 98.2°F | Ht 71.75 in | Wt 206.0 lb

## 2022-10-09 DIAGNOSIS — F12980 Cannabis use, unspecified with anxiety disorder: Secondary | ICD-10-CM

## 2022-10-09 DIAGNOSIS — F952 Tourette's disorder: Secondary | ICD-10-CM | POA: Diagnosis not present

## 2022-10-09 DIAGNOSIS — K648 Other hemorrhoids: Secondary | ICD-10-CM | POA: Diagnosis not present

## 2022-10-09 DIAGNOSIS — K645 Perianal venous thrombosis: Secondary | ICD-10-CM | POA: Diagnosis not present

## 2022-10-09 HISTORY — DX: Cannabis use, unspecified with anxiety disorder: F12.980

## 2022-10-09 NOTE — Assessment & Plan Note (Addendum)
Currently very bothered by a recent flare although its not tender much at present, would like referral to rectal surgeon for evaluate for procedural correction.

## 2022-10-09 NOTE — Progress Notes (Signed)
[place]  Phone: [phone]  New patient visit  Visit Date: 10/09/2022 Patient: Theodore Sanchez   DOB: 02-08-1976   47 y.o. Male  MRN: [id number]  Today's healthcare provider: Loralee Pacas, MD  Assessment and Plan:   Patient mainly came for hemorrhoid desires repair, but I diagnosed with tourettes and Theodore Sanchez agreed to see specialist for that.  We also discussed cannabis use, and Theodore Sanchez said Theodore Sanchez would return soon for Annual Preventive Medicine and Wellness Visit .  Theodore Sanchez was seen today for new patient (initial visit) and hemorrhoids.  Prolapsed hemorrhoids -     Ambulatory referral to Colorectal Surgery  Other hemorrhoids -     Ambulatory referral to Colorectal Surgery  Thrombosed hemorrhoids Overview: Extremely painful, first one age 37, hospital cut it., comes back every 2-3 years  Uses nightly stool softener and gets regular am bowel movement daily but the rare recurrences continued New year(s) developed cough associated prolapsed in and out hemorrhoid- given 2 week(s) supposity from urgent care that helped a little.    Suspect(s) secondary to resistance training and coughing due to normal bowel movements.  Assessment & Plan: Currently very bothered by a recent flare although its not tender much at present, would like referral to rectal surgeon for evaluate for procedural correction.   Orders: -     Ambulatory referral to Colorectal Surgery  Tourette's syndrome Overview: Noted in childhood by parents to have a stutter and vocal quiver getting stuck on words.  Patients chart review and interview were used to generate a prompt for artificial intelligence analysis ([employer] artificial intelligence) clinical decision support.  AI output was reviewed and is provided in red:   Comprehensive Review of the Case: The individual in question is a 47 year old who experiences anxiety and a vocal tic, which manifests as difficulty in articulating words at times,  accompanied by a slight shaking of the mouth. The patient uses cannabis to manage these symptoms. There is no additional information provided regarding the patient's past medical history, medications, allergies, social history, physical examination findings, laboratory data, course of illness, or imaging and other study findings. Without these details, it is challenging to construct a complete clinical picture; however, the information given will be used to generate a differential diagnosis. The absence of detailed medical, social, and family history, as well as the lack of physical examination findings, laboratory results, and imaging data, limits the ability to narrow down the diagnosis.  Most Likely Dx:  Tourette Syndrome (TS) is the most likely diagnosis for this individual. TS is a neurological disorder characterized by repetitive, stereotyped, involuntary movements and vocalizations called tics. The patient's vocal tic and the use of cannabis for symptom management are consistent with TS, which often includes the use of various substances to control tics and associated anxiety. The presence of motor tics, a history of onset before the age of 30, and a duration of tics for more than one year could further suggest this diagnosis.  Expanded DDx:  Functional Movement Disorder (FMD): FMD is a condition where patients experience abnormal movements or positioning of the body due to a dysfunction in the nervous system. The patient's vocal tic and mouth shaking could be manifestations of FMD. This diagnosis would be supported by the presence of symptoms that are incongruent with recognized neurological or medical conditions and the absence of a consistent organic pathology on examination and investigations.  Cannabis Use Disorder: Chronic cannabis use can lead to a range of psychological and  neurological symptoms, including anxiety and speech difficulties. The patient's reliance on cannabis to manage symptoms  raises the possibility of a substance-induced disorder. The presence of a temporal relationship between cannabis use and symptom onset, as well as the improvement of symptoms upon cessation of cannabis, could further suggest this diagnosis.  Alternative DDx:  Hemifacial Spasm: This is a nervous system disorder in which the muscles on one side of the face twitch involuntarily. The patient's description of mouth shaking could be consistent with this condition. The presence of unilateral facial muscle contractions that are typically painless and the absence of other neurological deficits could further suggest this diagnosis.  Psychogenic Non-Epileptic Seizures (PNES): PNES are episodes that resemble epileptic seizures but are psychological in origin. The patient's symptoms could be misinterpreted as a form of PNES, particularly if stress or psychological factors exacerbate them. The presence of episodes that do not correspond to the typical patterns seen in epilepsy on an electroencephalogram (EEG) could further suggest this diagnosis.  Anxiety Disorder with Somatic Symptom Presentation: Anxiety disorders can manifest with a variety of physical symptoms, including those affecting speech. The patient's anxiety and vocal tic could be a somatic expression of an underlying anxiety disorder. The presence of excessive worry, avoidance behaviors, and other physical symptoms of anxiety could further suggest this diagnosis.  Social Anxiety Disorder: This condition is characterized by significant anxiety and discomfort in social situations, which can lead to physical manifestations such as difficulty speaking. The presence of fear of social situations, avoidance of social interactions, and physical symptoms triggered by social exposure could further suggest this diagnosis.  Adult-Onset Tic Disorder: While less common than in children, adults can develop tic disorders. The presence of a recent onset of tics without a  childhood history and the absence of other neurological findings could further suggest this diagnosis.  Hypocalcemia: Low calcium levels can cause neuromuscular irritability, which might manifest as a tic or tremor. The presence of symptoms such as muscle cramps, paresthesias, and a positive Chvostek or Trousseau sign could further suggest this diagnosis.  Parkinson's Disease: Early or atypical presentations of Parkinson's disease can sometimes include speech disturbances. The presence of other motor symptoms such as bradykinesia, rigidity, and rest tremor could further suggest this diagnosis.  Multiple Sclerosis (MS): MS can cause a variety of neurological symptoms, including those affecting speech. The presence of other neurological deficits, such as visual disturbances or motor weakness, and findings on MRI such as demyelinating lesions could further suggest this diagnosis.  Assessment & Plan: Offered antipsychotic declined Referred to specialist  Patients chart review and interview were used to generate a prompt for artificial intelligence analysis ([employer] artificial intelligence) clinical decision support.  AI output was reviewed and is provided in red:   #Anxiety and Vocal Tic  The individual presents with anxiety and a vocal tic characterized by difficulty articulating words and slight shaking of the mouth. The use of cannabis to manage these symptoms is noted. The absence of a comprehensive medical history, including the onset and duration of symptoms, as well as the lack of physical examination findings and additional data, precludes a definitive diagnosis. However, the description of a vocal tic and anxiety, along with self-management using cannabis, raises the possibility of a primary tic disorder such as Tourette Syndrome (TS), particularly if the tics started in childhood and have persisted for over a year. The differential diagnosis includes Tourette Syndrome, Functional Movement  Disorder, Cannabis Use Disorder, Hemifacial Spasm, Psychogenic Non-Epileptic Seizures, Anxiety Disorder with Somatic Symptom  Presentation, Social Anxiety Disorder, Adult-Onset Tic Disorder, Hypocalcemia, Parkinson's Disease, and Multiple Sclerosis.  Diagnosis: - Comprehensive psychiatric evaluation to assess for the presence of tics, their onset, and associated psychiatric comorbidities. - Neurological examination to evaluate for motor or vocal tics and to rule out other neurological conditions. - Serum calcium and magnesium levels to exclude metabolic causes such as hypocalcemia. - Urine toxicology screen to assess for cannabis use and other substances. - Brain MRI to rule out structural abnormalities that could contribute to the symptoms. - EEG if seizure-like activity is suspected to rule out epilepsy or Psychogenic Non-Epileptic Seizures.  Treatment(s): - Behavioral therapy, such as Comprehensive Behavioral Intervention for Tics (CBIT), if a tic disorder is diagnosed. - Medication management, which may include antipsychotics such as risperidone or aripiprazole for severe tics, if indicated. - Counseling and psychotherapy to address anxiety and any underlying psychiatric conditions. - Consideration of cessation or reduction of cannabis use with support from addiction services if a substance use disorder is identified. - Referral to a neurologist for further management if a neurological disorder is suspected based on the diagnostic workup.  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  Did speech therapy in childhood.   Assessment & Plan: Doesn't seem to cause anxiety and seems to help tourette but I recommended trial discontinue it for  a few weeks and see what impact that has.         Subjective:  Patient presents today to establish care.  Chief Complaint   Patient presents with   New Patient (Initial Visit)    Will do labs at a later time.   Hemorrhoids    Recurrent for seven years, affects lifestyle somewhat-was prescribed suppositories that did not help. Has had three cut and drained.    Problem-oriented charting was used to develop and update his medical history: Problem  Tourette's Syndrome   Noted in childhood by parents to have a stutter and vocal quiver getting stuck on words.  Patients chart review and interview were used to generate a prompt for artificial intelligence analysis ([employer]) clinical decision support.  AI output was reviewed and is provided in red:   Comprehensive Review of the Case: The individual in question is a 48 year old who experiences anxiety and a vocal tic, which manifests as difficulty in articulating words at times, accompanied by a slight shaking of the mouth. The patient uses cannabis to manage these symptoms. There is no additional information provided regarding the patient's past medical history, medications, allergies, social history, physical examination findings, laboratory data, course of illness, or imaging and other study findings. Without these details, it is challenging to construct a complete clinical picture; however, the information given will be used to generate a differential diagnosis. The absence of detailed medical, social, and family history, as well as the lack of physical examination findings, laboratory results, and imaging data, limits the ability to narrow down the diagnosis.  Most Likely Dx:  Tourette Syndrome (TS) is the most likely diagnosis for this individual. TS is a neurological disorder characterized by repetitive, stereotyped, involuntary movements and vocalizations called tics. The patient's vocal tic and the use of cannabis for symptom management are consistent with TS, which often includes the use of various substances to control tics and associated  anxiety. The presence of motor tics, a history of onset before the age of 59, and  a duration of tics for more than one year could further suggest this diagnosis.  Expanded DDx:  Functional Movement Disorder (FMD): FMD is a condition where patients experience abnormal movements or positioning of the body due to a dysfunction in the nervous system. The patient's vocal tic and mouth shaking could be manifestations of FMD. This diagnosis would be supported by the presence of symptoms that are incongruent with recognized neurological or medical conditions and the absence of a consistent organic pathology on examination and investigations.  Cannabis Use Disorder: Chronic cannabis use can lead to a range of psychological and neurological symptoms, including anxiety and speech difficulties. The patient's reliance on cannabis to manage symptoms raises the possibility of a substance-induced disorder. The presence of a temporal relationship between cannabis use and symptom onset, as well as the improvement of symptoms upon cessation of cannabis, could further suggest this diagnosis.  Alternative DDx:  Hemifacial Spasm: This is a nervous system disorder in which the muscles on one side of the face twitch involuntarily. The patient's description of mouth shaking could be consistent with this condition. The presence of unilateral facial muscle contractions that are typically painless and the absence of other neurological deficits could further suggest this diagnosis.  Psychogenic Non-Epileptic Seizures (PNES): PNES are episodes that resemble epileptic seizures but are psychological in origin. The patient's symptoms could be misinterpreted as a form of PNES, particularly if stress or psychological factors exacerbate them. The presence of episodes that do not correspond to the typical patterns seen in epilepsy on an electroencephalogram (EEG) could further suggest this diagnosis.  Anxiety Disorder with Somatic Symptom  Presentation: Anxiety disorders can manifest with a variety of physical symptoms, including those affecting speech. The patient's anxiety and vocal tic could be a somatic expression of an underlying anxiety disorder. The presence of excessive worry, avoidance behaviors, and other physical symptoms of anxiety could further suggest this diagnosis.  Social Anxiety Disorder: This condition is characterized by significant anxiety and discomfort in social situations, which can lead to physical manifestations such as difficulty speaking. The presence of fear of social situations, avoidance of social interactions, and physical symptoms triggered by social exposure could further suggest this diagnosis.  Adult-Onset Tic Disorder: While less common than in children, adults can develop tic disorders. The presence of a recent onset of tics without a childhood history and the absence of other neurological findings could further suggest this diagnosis.  Hypocalcemia: Low calcium levels can cause neuromuscular irritability, which might manifest as a tic or tremor. The presence of symptoms such as muscle cramps, paresthesias, and a positive Chvostek or Trousseau sign could further suggest this diagnosis.  Parkinson's Disease: Early or atypical presentations of Parkinson's disease can sometimes include speech disturbances. The presence of other motor symptoms such as bradykinesia, rigidity, and rest tremor could further suggest this diagnosis.  Multiple Sclerosis (MS): MS can cause a variety of neurological symptoms, including those affecting speech. The presence of other neurological deficits, such as visual disturbances or motor weakness, and findings on MRI such as demyelinating lesions could further suggest this diagnosis.   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  Did speech therapy in childhood.    Thrombosed Hemorrhoids   Extremely  painful, first one age 27, hospital cut it., comes back every 2-3 years  Uses nightly stool softener and gets regular am bowel movement daily but the rare recurrences continued New year(s) developed cough associated prolapsed  in and out hemorrhoid- given 2 week(s) supposity from urgent care that helped a little.    Suspect(s) secondary to resistance training and coughing due to normal bowel movements.      Depression Screen    10/09/2022   10:21 AM  PHQ 2/9 Scores  PHQ - 2 Score 0   No results found for any visits on 10/09/22.  The following were reviewed and entered/updated into his MEDICAL RECORD [place] History:  Diagnosis Date   Alopecia    Anxiety    Cannabis use with anxiety disorder ([place]) 10/09/2022   Past Surgical History:  Procedure Laterality Date   ANTERIOR CRUCIATE LIGAMENT REPAIR     2009    dog bite surgery      age 50 - plastic surgery on face    EYE SURGERY     knife in eye surgery- left eye    EYE SURGERY     right eye- due to dirt in eye    ORIF PATELLA Right 09/19/2013   Procedure: OPEN REDUCTION INTERNAL (ORIF) FIXATION RIGHT KNEE PATELLA;  Surgeon: Sydnee Cabal, MD;  Location: [facility];  Service: Orthopedics;  Laterality: Right;   Family History  Problem Relation Age of Onset   Cancer Mother    Cancer Father    CAD Other    Outpatient Medications Prior to Visit  Medication Sig Dispense Refill   ibuprofen (ADVIL) 200 MG tablet Take by mouth.     lidocaine-prilocaine (EMLA) cream Apply three times a day as needed for relief     aspirin EC 325 MG tablet Take 1 tablet (325 mg total) by mouth 2 (two) times daily. (Patient not taking: Reported on 10/09/2022) 60 tablet 0   cephALEXin (KEFLEX) 500 MG capsule Take 1 capsule (500 mg total) by mouth 3 (three) times daily. (Patient not taking: Reported on 10/09/2022) 12 capsule 0   methocarbamol (ROBAXIN) 500 MG tablet Take 1 tablet (500 mg total) by mouth every 6 (six) hours as needed for muscle spasms.  (Patient not taking: Reported on 10/09/2022) 50 tablet 2   oxyCODONE-acetaminophen (ROXICET) 5-325 MG tablet Take 1 tablet by mouth every 4 (four) hours as needed for severe pain. (Patient not taking: Reported on 10/09/2022) 10 tablet 0   No facility-administered medications prior to visit.    No Known Allergies Social History   Tobacco Use   Smoking status: Former   Smokeless tobacco: Current    Types: Snuff, Chew  Vaping Use   Vaping Use: Never used  Substance Use Topics   Alcohol use: No    Comment: hx of in 20s    Drug use: Not Currently    Types: Marijuana    Comment: xanax prn lsat used 09/14/13 per patient      There is no immunization history on file for this patient.  Objective:  BP 114/67 (BP Location: Left Arm, Patient Position: Sitting)   Pulse 64   Temp 98.2 F (36.8 C) (Temporal)   Ht 5' 11.75" (1.822 m)   Wt 206 lb (93.4 kg)   SpO2 99%   BMI 28.13 kg/m  Body mass index is 28.13 kg/m. indicates this is an Overweight male , but waist circumference is a better indicator of healthy body composition.Theodore Sanchez very muscular so Theodore Sanchez seems healthy Physical Exam  Vital signs reviewed.  Nursing notes reviewed. General Appearance/Constitutional:  polite male in no acute distress Musculoskeletal: All extremities are intact.  Neurological:  Awake, alert,  No obvious focal neurological deficits or  cognitive impairments Psychiatric:  Appropriate mood, pleasant demeanor Problem-specific findings:  alopecia, vocal tic with mouth shaking stutter intermittent, very intelligent    Results Reviewed: Results for orders placed or performed during the hospital encounter of 02/23/20  Urinalysis, Routine w reflex microscopic  Result Value Ref Range   Color, Urine YELLOW YELLOW   APPearance CLEAR CLEAR   Specific Gravity, Urine 1.020 1.005 - 1.030   pH 7.0 5.0 - 8.0   Glucose, UA NEGATIVE NEGATIVE mg/dL   Hgb urine dipstick NEGATIVE NEGATIVE   Bilirubin Urine NEGATIVE NEGATIVE   Ketones,  ur NEGATIVE NEGATIVE mg/dL   Protein, ur NEGATIVE NEGATIVE mg/dL   Nitrite NEGATIVE NEGATIVE   Leukocytes,Ua NEGATIVE NEGATIVE

## 2022-10-09 NOTE — Assessment & Plan Note (Signed)
Doesn't seem to cause anxiety and seems to help tourette but I recommended trial discontinue it for  a few weeks and see what impact that has.

## 2022-10-09 NOTE — Assessment & Plan Note (Deleted)
Uses to manage anxiety and speech disorder that is probably tourettes.  Did speech therapy in childhood.

## 2022-10-09 NOTE — Assessment & Plan Note (Addendum)
Offered antipsychotic declined Referred to specialist  Patients chart review and interview were used to generate a prompt for artificial intelligence analysis ([employer] artificial [employer]) clinical decision support.  AI output was reviewed and is provided in red:   #Anxiety and Vocal Tic  The individual presents with anxiety and a vocal tic characterized by difficulty articulating words and slight shaking of the mouth. The use of cannabis to manage these symptoms is noted. The absence of a comprehensive medical history, including the onset and duration of symptoms, as well as the lack of physical examination findings and additional data, precludes a definitive diagnosis. However, the description of a vocal tic and anxiety, along with self-management using cannabis, raises the possibility of a primary tic disorder such as Tourette Syndrome (TS), particularly if the tics started in childhood and have persisted for over a year. The differential diagnosis includes Tourette Syndrome, Functional Movement Disorder, Cannabis Use Disorder, Hemifacial Spasm, Psychogenic Non-Epileptic Seizures, Anxiety Disorder with Somatic Symptom Presentation, Social Anxiety Disorder, Adult-Onset Tic Disorder, Hypocalcemia, Parkinson's Disease, and Multiple Sclerosis.  Diagnosis: - Comprehensive psychiatric evaluation to assess for the presence of tics, their onset, and associated psychiatric comorbidities. - Neurological examination to evaluate for motor or vocal tics and to rule out other neurological conditions. - Serum calcium and magnesium levels to exclude metabolic causes such as hypocalcemia. - Urine toxicology screen to assess for cannabis use and other substances. - Brain MRI to rule out structural abnormalities that could contribute to the symptoms. - EEG if seizure-like activity is suspected to rule out epilepsy or Psychogenic Non-Epileptic Seizures.  Treatment(s): - Behavioral therapy, such as  Comprehensive Behavioral Intervention for Tics (CBIT), if a tic disorder is diagnosed. - Medication management, which may include antipsychotics such as risperidone or aripiprazole for severe tics, if indicated. - Counseling and psychotherapy to address anxiety and any underlying psychiatric conditions. - Consideration of cessation or reduction of cannabis use with support from addiction services if a substance use disorder is identified. - Referral to a neurologist for further management if a neurological disorder is suspected based on the diagnostic workup.

## 2022-12-29 ENCOUNTER — Encounter: Payer: Self-pay | Admitting: Gastroenterology

## 2022-12-29 ENCOUNTER — Ambulatory Visit: Payer: Commercial Managed Care - HMO | Admitting: Gastroenterology

## 2022-12-29 VITALS — BP 122/76 | HR 70 | Ht 71.0 in | Wt 209.0 lb

## 2022-12-29 DIAGNOSIS — Z1211 Encounter for screening for malignant neoplasm of colon: Secondary | ICD-10-CM | POA: Diagnosis not present

## 2022-12-29 DIAGNOSIS — K648 Other hemorrhoids: Secondary | ICD-10-CM | POA: Diagnosis not present

## 2022-12-29 MED ORDER — NA SULFATE-K SULFATE-MG SULF 17.5-3.13-1.6 GM/177ML PO SOLN: 1.0000 | Freq: Once | ORAL | 0 refills | Status: AC

## 2022-12-29 NOTE — Patient Instructions (Addendum)
You have been scheduled for a colonoscopy. Please follow written instructions given to you at your visit today.  Please pick up your prep supplies at the pharmacy within the next 1-3 days. If you use inhalers (even only as needed), please bring them with you on the day of your procedure.   _______________________________________________________  If your blood pressure at your visit was 140/90 or greater, please contact your primary care physician to follow up on this.  _______________________________________________________  If you are age 45 or younger, your body mass index should be between 19-25. Your Body mass index is 29.15 kg/m. If this is out of the aformentioned range listed, please consider follow up with your Primary Care Provider.   __________________________________________________________  The [facility] [facility] providers would like to encourage you to use [facility] to communicate with providers for non-urgent requests or questions.  Due to long hold times on the telephone, sending your provider a message by [facility] may be a faster and more efficient way to get a response.  Please allow 48 business hours for a response.  Please remember that this is for non-urgent requests.   Due to recent changes in healthcare laws, you may see the results of your imaging and laboratory studies on MyChart before your provider has had a chance to review them.  We understand that in some cases there may be results that are confusing or concerning to you. Not all laboratory results come back in the same time frame and the provider may be waiting for multiple results in order to interpret others.  Please give [place] 48 hours in order for your provider to thoroughly review all the results before contacting the office for clarification of your results.     Thank you for choosing me and [facility].  Vito Cirigliano, D.O. .dottie

## 2022-12-29 NOTE — Progress Notes (Signed)
Chief Complaint: Symptomatic hemorrhoids,   Referring Provider:     Marin Olp, MD   HPI:     Theodore Sanchez is a 47 y.o. male referred to the Gastroenterology Clinic for evaluation of symptomatic hemorrhoids.  He reports history of presumably thrombosed hemorrhoids requiring I&D several times in the past.  First episode was around age 66 requiring I&D. Did well for a few years, but sxs recurred 2-3 years later, again requiring I&D. Another episode 3 years later, again requiring I&D. In between these events would have minor hemorrhoidal sxs that resolved with conservative managemenbt.   In 08/2022, sxs started to recur in the setting of constipation.  Constipation resolved quickly, but anorectal symptoms persisted for several weeks, treated with conservative management.  He describes the symptoms as different from any of his previous episodes.  Felt more of an internal component compared with previous episodes.    He was evaluated by Dr. Cliffton Asters at Colorectal Surgery on 10/28/2022 for this issue.  Anorectal exam without significant external hemorrhoid component, soft decompressed tags.  Anoscopy with healthy appearing anoderm, small internal hemorrhoid components without ulceration, fissure, granulation tissue.  He was referred to the GI clinic for further evaluation and colonoscopy.  Also recommended starting fiber supplement, with follow-up in 3 months if continued symptoms.  Takes Colace daily and drinks 64 ounces of water daily with regular, soft stools. Active lifestyle (boxing, exercise, coaching).   No dyschezia.  No bleeding.  He continues to have sxs which affects his QOL.  No previous colonoscopy.  No recent abdominal imaging for review.  No known family history of CRC, GI malignancy, liver disease, pancreatic disease, or IBD.    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     Past  Surgical History:  Procedure Laterality Date   ANTERIOR CRUCIATE LIGAMENT REPAIR     2009    dog bite surgery      age 62 - plastic surgery on face    EYE SURGERY     knife in eye surgery- left eye    EYE SURGERY     right eye- due to dirt in eye    ORIF PATELLA Right 09/19/2013   Procedure: OPEN REDUCTION INTERNAL (ORIF) FIXATION RIGHT KNEE PATELLA;  Surgeon: Eugenia Mcalpine, MD;  Location: [facility];  Service: Orthopedics;  Laterality: Right;   Family History  Problem Relation Age of Onset   Breast cancer Mother    Cancer Father    CAD Other    Social History   Tobacco Use   Smoking status: Former   Smokeless tobacco: Current    Types: Snuff, Chew  Vaping Use   Vaping Use: Never used  Substance Use Topics   Alcohol use: No    Comment: hx of in 20s    Drug use: Not Currently    Types: Marijuana    Comment: xanax prn lsat used 09/14/13 per patient    Current Outpatient Medications  Medication Sig Dispense Refill   ibuprofen (ADVIL) 200 MG tablet Take by mouth.     lidocaine-prilocaine (EMLA) cream Apply three times a day as needed for relief (Patient not taking: Reported on 12/29/2022)     No current facility-administered medications for this visit.   No Known Allergies   Review of Systems: All systems reviewed and negative except where noted in  HPI.     Physical Exam:    Wt Readings from Last 3 Encounters:  12/29/22 209 lb (94.8 kg)  10/09/22 206 lb (93.4 kg)  02/23/20 206 lb (93.4 kg)    BP 122/76   Pulse 70   Ht  (1.803 m)   Wt 209 lb (94.8 kg)   BMI 29.15 kg/m  Constitutional:  Pleasant, in no acute distress. Psychiatric: Normal mood and affect. Behavior is normal. Neurological: Alert and oriented to person place and time. Skin: Skin is warm and dry. No rashes noted.   ASSESSMENT AND PLAN;   1) Colon CA screening - Due for age-appropriate CRC screening - Will schedule colonoscopy  2) Symptomatic hemorrhoids Nearly 10-year history of  symptomatic hemorrhoids which has required I&D x 3 in the past.  Recurrence of hemorrhoidal type symptoms in 08/2022 which has persisted and feels "different" from previous episodes.  Symptoms are clearly affecting his QOL.  Anorectal exam in the Colorectal Surgery clinic was largely unremarkable. - Evaluate for more proximal mucosal/luminal pathology at time of colonoscopy as above - If clinically significant hemorrhoids on colonoscopy, plan for either hemorrhoid band ligation vs hemorrhoidectomy depending on degree of scarring which may or may not be present from previous I&D procedures - If requiring intervention of hemorrhoids, can be done in this office or with Dr. Cliffton Asters depending on colonoscopy findings - Continue conservative management for the time being   The indications, risks, and benefits of colonoscopy were explained to the patient in detail. Risks include but are not limited to bleeding, perforation, adverse reaction to medications, and cardiopulmonary compromise. Sequelae include but are not limited to the possibility of surgery, hospitalization, and mortality. The patient verbalized understanding and wished to proceed. All questions answered, referred to the scheduler and bowel prep ordered. Further recommendations pending results of the exam.     Shellia Cleverly, DO, FACG  12/29/2022, 2:08 PM   Theodore Olszewski, MD

## 2023-01-29 ENCOUNTER — Encounter: Payer: Self-pay | Admitting: Gastroenterology

## 2023-01-29 ENCOUNTER — Ambulatory Visit (AMBULATORY_SURGERY_CENTER): Payer: Commercial Managed Care - HMO | Admitting: Gastroenterology

## 2023-01-29 VITALS — BP 150/84 | HR 54 | Temp 97.5°F | Resp 12 | Ht 71.0 in | Wt 209.0 lb

## 2023-01-29 DIAGNOSIS — D123 Benign neoplasm of transverse colon: Secondary | ICD-10-CM

## 2023-01-29 DIAGNOSIS — K641 Second degree hemorrhoids: Secondary | ICD-10-CM

## 2023-01-29 DIAGNOSIS — Z1211 Encounter for screening for malignant neoplasm of colon: Secondary | ICD-10-CM

## 2023-01-29 DIAGNOSIS — D125 Benign neoplasm of sigmoid colon: Secondary | ICD-10-CM | POA: Diagnosis not present

## 2023-01-29 DIAGNOSIS — D122 Benign neoplasm of ascending colon: Secondary | ICD-10-CM | POA: Diagnosis not present

## 2023-01-29 MED ORDER — SODIUM CHLORIDE 0.9 % IV SOLN: 500.0000 mL | Freq: Once | INTRAVENOUS | Status: DC

## 2023-01-29 NOTE — Progress Notes (Signed)
Called to room to assist during endoscopic procedure.  Patient ID and intended procedure confirmed with present staff. Received instructions for my participation in the procedure from the performing physician.  

## 2023-01-29 NOTE — Progress Notes (Signed)
Vitals-DT  Pt's states no medical or surgical changes since previsit or office visit.  

## 2023-01-29 NOTE — Progress Notes (Signed)
Uneventful anesthetic. Report to pacu rn. Vss. Care resumed by rn. 

## 2023-01-29 NOTE — Op Note (Signed)
[facility] Patient Name: Theodore Sanchez Procedure Date: 01/29/2023 2:10 PM MRN: [id number] Endoscopist: Doristine Locks , MD, [id number] Age: 47 Referring MD:  Date of Birth: 1975-12-30 Gender: Male Account #: [id number] Procedure:                Colonoscopy Indications:              Screening for colorectal malignant neoplasm, This                            is the patient's first colonoscopy Medicines:                Monitored Anesthesia Care Procedure:                Pre-Anesthesia Assessment:                           - Prior to the procedure, a History and Physical                            was performed, and patient medications and                            allergies were reviewed. The patient's tolerance of                            previous anesthesia was also reviewed. The risks                            and benefits of the procedure and the sedation                            options and risks were discussed with the patient.                            All questions were answered, and informed consent                            was obtained. Prior Anticoagulants: The patient has                            taken no anticoagulant or antiplatelet agents. ASA                            Grade Assessment: I - A normal, healthy patient.                            After reviewing the risks and benefits, the patient                            was deemed in satisfactory condition to undergo the                            procedure.  After obtaining informed consent, the colonoscope                            was passed under direct vision. Throughout the                            procedure, the patient's blood pressure, pulse, and                            oxygen saturations were monitored continuously. The                            CF [id number] #[id number] was introduced through the anus                            and advanced to the the terminal  ileum. The                            colonoscopy was performed without difficulty. The                            patient tolerated the procedure well. The quality                            of the bowel preparation was good. The terminal                            ileum, ileocecal valve, appendiceal orifice, and                            rectum were photographed. Scope In: 2:11:42 PM Scope Out: 2:30:42 PM Scope Withdrawal Time: 0 hours 16 minutes 38 seconds  Total Procedure Duration: 0 hours 19 minutes 0 seconds  Findings:                 The perianal and digital rectal examinations were                            normal.                           An 8 mm polyp was found in the ascending colon. The                            polyp was sessile. The polyp was removed with a                            cold snare. Resection and retrieval were complete.                            Estimated blood loss was minimal.                           Two sessile polyps were found in the sigmoid colon  and splenic flexure. The polyps were 2 to 4 mm in                            size. These polyps were removed with a cold snare.                            Resection and retrieval were complete. Estimated                            blood loss was minimal.                           Non-bleeding internal hemorrhoids were found during                            retroflexion. The hemorrhoids were medium-sized.                           The terminal ileum appeared normal.                           Retroflexion in the right colon was performed. Complications:            No immediate complications. Estimated Blood Loss:     Estimated blood loss was minimal. Impression:               - One 8 mm polyp in the ascending colon, removed                            with a cold snare. Resected and retrieved.                           - Two 2 to 4 mm polyps in the sigmoid colon and at                             the splenic flexure, removed with a cold snare.                            Resected and retrieved.                           - Non-bleeding internal hemorrhoids.                           - The examined portion of the ileum was normal.                           - The GI Genius (intelligent endoscopy module),                            computer-aided polyp detection system powered by AI                            was utilized to detect colorectal polyps through  enhanced visualization during colonoscopy. Recommendation:           - Patient has a contact number available for                            emergencies. The signs and symptoms of potential                            delayed complications were discussed with the                            patient. Return to normal activities tomorrow.                            Written discharge instructions were provided to the                            patient.                           - Resume previous diet.                           - Continue present medications.                           - Await pathology results.                           - Repeat colonoscopy for surveillance based on                            pathology results.                           - Return to GI office PRN. Doristine Locks, MD 01/29/2023 2:36:48 PM

## 2023-01-29 NOTE — Progress Notes (Signed)
GASTROENTEROLOGY PROCEDURE H&P NOTE   Primary Care Physician: [name], MD    Reason for Procedure:  Colon cancer screening  Plan:    Colonoscopy  Patient is appropriate for endoscopic procedure(s) in the ambulatory ([facility]) setting.  The nature of the procedure, as well as the risks, benefits, and alternatives were carefully and thoroughly reviewed with the patient. Ample time for discussion and questions allowed. The patient understood, was satisfied, and agreed to proceed.     HPI: Theodore Sanchez is a 47 y.o. male who presents for colonoscopy for CRC screening.  Separately, does have a nearly 10-year history of symptomatic hemorrhoids with I&D x 3 in the past.  Will evaluate for additional more proximal pathology at the time of colonoscopy today.  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    Past Surgical History:  Procedure Laterality Date   ANTERIOR CRUCIATE LIGAMENT REPAIR     2009    dog bite surgery      age 83 - plastic surgery on face    EYE SURGERY     knife in eye surgery- left eye    EYE SURGERY     right eye- due to dirt in eye    ORIF PATELLA Right 09/19/2013   Procedure: OPEN REDUCTION INTERNAL (ORIF) FIXATION RIGHT KNEE PATELLA;  Surgeon: Eugenia Mcalpine, MD;  Location: [facility];  Service: Orthopedics;  Laterality: Right;    Prior to Admission medications   Medication Sig Start Date End Date Taking? Authorizing Provider  ibuprofen (ADVIL) 200 MG tablet Take by mouth.   Yes [provider]  lidocaine-prilocaine (EMLA) cream Apply three times a day as needed for relief Patient not taking: Reported on 12/29/2022 01/02/21   [provider]    Current Outpatient Medications  Medication Sig Dispense Refill   ibuprofen (ADVIL) 200 MG tablet Take by mouth.     lidocaine-prilocaine (EMLA) cream Apply three times a day as needed for relief (Patient not taking:  Reported on 12/29/2022)     Current Facility-Administered Medications  Medication Dose Route Frequency Provider Last Rate Last Admin   0.9 %  sodium chloride infusion  500 mL Intravenous Once Keyonta Madrid V, DO        Allergies as of 01/29/2023   (No Known Allergies)    Family History  Problem Relation Age of Onset   Breast cancer Mother    Cancer Father    CAD Other     Social History   Socioeconomic History   Marital status: Married    Spouse name: Not on file   Number of children: 2   Years of education: Not on file   Highest education level: Not on file  Occupational History   Occupation: bail bondman  Tobacco Use   Smoking status: Former   Smokeless tobacco: Current    Types: Snuff, Chew  Vaping Use   Vaping Use: Never used  Substance and Sexual Activity   Alcohol use: No    Comment: hx of in 20s    Drug use: Not Currently    Types: Marijuana    Comment: xanax prn lsat used 09/14/13 per patient    Sexual activity: Not on file  Other Topics Concern   Not on file  Social History Narrative   Not on file   Social Determinants of Health   Financial Resource Strain: Not on file  Food Insecurity: Not on file  Transportation Needs: Not on file  Physical Activity: Not on file  Stress: Not on file  Social Connections: Not on file  Intimate Partner Violence: Not on file    Physical Exam: Vital signs in last 24 hours: @BP  103/67 (BP Location: Right Arm, Patient Position: Sitting, Cuff Size: Normal)   Pulse 60   Temp (!) 97.5 F (36.4 C) (Temporal)   Ht 5\' 11"  (1.803 m)   Wt 209 lb (94.8 kg)   SpO2 99%   BMI 29.15 kg/m  GEN: NAD EYE: Sclerae anicteric ENT: MMM CV: Non-tachycardic Pulm: CTA b/l GI: Soft, NT/ND NEURO:  Alert & Oriented x 3   Doristine Locks, DO [facility]   01/29/2023 1:22 PM

## 2023-01-29 NOTE — Patient Instructions (Signed)
Handouts Provided:  Polyps  YOU HAD AN ENDOSCOPIC PROCEDURE TODAY AT THE [facility]:   Refer to the procedure report that was given to you for any specific questions about what was found during the examination.  If the procedure report does not answer your questions, please call your gastroenterologist to clarify.  If you requested that your care partner not be given the details of your procedure findings, then the procedure report has been included in a sealed envelope for you to review at your convenience later.  YOU SHOULD EXPECT: Some feelings of bloating in the abdomen. Passage of more gas than usual.  Walking can help get rid of the air that was put into your GI tract during the procedure and reduce the bloating. If you had a lower endoscopy (such as a colonoscopy or flexible sigmoidoscopy) you may notice spotting of blood in your stool or on the toilet paper. If you underwent a bowel prep for your procedure, you may not have a normal bowel movement for a few days.  Please Note:  You might notice some irritation and congestion in your nose or some drainage.  This is from the oxygen used during your procedure.  There is no need for concern and it should clear up in a day or so.  SYMPTOMS TO REPORT IMMEDIATELY:  Following lower endoscopy (colonoscopy or flexible sigmoidoscopy):  Excessive amounts of blood in the stool  Significant tenderness or worsening of abdominal pains  Swelling of the abdomen that is new, acute  Fever of 100F or higher  For urgent or emergent issues, a gastroenterologist can be reached at any hour by calling [phone]. Do not use MyChart messaging for urgent concerns.    DIET:  We do recommend a small meal at first, but then you may proceed to your regular diet.  Drink plenty of fluids but you should avoid alcoholic beverages for 24 hours.  ACTIVITY:  You should plan to take it easy for the rest of today and you should NOT DRIVE or use heavy  machinery until tomorrow (because of the sedation medicines used during the test).    FOLLOW UP: Our staff will call the number listed on your records the next business day following your procedure.  We will call around 7:15- 8:00 am to check on you and address any questions or concerns that you may have regarding the information given to you following your procedure. If we do not reach you, we will leave a message.     If any biopsies were taken you will be contacted by phone or by letter within the next 1-3 weeks.  Please call us at [phone] if you have not heard about the biopsies in 3 weeks.    SIGNATURES/CONFIDENTIALITY: You and/or your care partner have signed paperwork which will be entered into your electronic medical record.  These signatures attest to the fact that that the information above on your After Visit Summary has been reviewed and is understood.  Full responsibility of the confidentiality of this discharge information lies with you and/or your care-partner.  

## 2023-02-02 ENCOUNTER — Telehealth: Payer: Self-pay | Admitting: *Deleted

## 2023-02-02 NOTE — Telephone Encounter (Signed)
-----   Message from Dayton Scrape Monday, RN sent at 02/02/2023  7:46 AM EDT ----- Regarding: Hemorrhoid banding Dottie,    I wasn't sure if Dr. Rhea Belton did the banding. I did the patient's follow up call this morning and he was asking about scheduling this? Can you help with this?  Thanks Maralyn Sago

## 2023-02-02 NOTE — Telephone Encounter (Signed)
  Follow up Call-     01/29/2023    1:08 PM 01/29/2023   12:59 PM  Call back number  Post procedure Call Back phone  # [phone]   Permission to leave phone message  Yes     Patient questions:  Do you have a fever, pain , or abdominal swelling? No. Pain Score  0 *  Have you tolerated food without any problems? Yes.    Have you been able to return to your normal activities? Yes.    Do you have any questions about your discharge instructions: Diet   No. Medications  No. Follow up visit  No.  Do you have questions or concerns about your Care? Yes Pt was wishing to schedule his hemorrhoid banding. Will make Dr. Rhea Belton aware.  Actions: * If pain score is 4 or above: No action needed, pain <4.

## 2023-02-02 NOTE — Telephone Encounter (Signed)
Dr Barron Alvine patient. I have spoken to patient and scheduled him for banding #1 with Dr Barron Alvine on 02/03/23.

## 2023-02-03 ENCOUNTER — Encounter: Payer: Self-pay | Admitting: Gastroenterology

## 2023-02-03 ENCOUNTER — Ambulatory Visit: Payer: Commercial Managed Care - HMO | Admitting: Gastroenterology

## 2023-02-03 VITALS — BP 130/88 | HR 52 | Ht 71.0 in | Wt 206.0 lb

## 2023-02-03 DIAGNOSIS — K641 Second degree hemorrhoids: Secondary | ICD-10-CM

## 2023-02-03 DIAGNOSIS — Z860100 Personal history of colon polyps, unspecified: Secondary | ICD-10-CM

## 2023-02-03 NOTE — Patient Instructions (Addendum)
You have been scheduled for an appointment with Dr. Barron Alvine on 04/13/23 at 1120 AM . Please arrive 10 minutes early for your appointment.  _______________________________________________________  If your blood pressure at your visit was 140/90 or greater, please contact your primary care physician to follow up on this.  _______________________________________________________  If you are age 45 or older, your body mass index should be between 23-30. Your Body mass index is 28.73 kg/m. If this is out of the aforementioned range listed, please consider follow up with your Primary Care Provider.  If you are age 75 or younger, your body mass index should be between 19-25. Your Body mass index is 28.73 kg/m. If this is out of the aformentioned range listed, please consider follow up with your Primary Care Provider.   __________________________________________________________  The [facility] providers would like to encourage you to use [facility] to communicate with providers for non-urgent requests or questions.  Due to long hold times on the telephone, sending your provider a message by [facility] may be a faster and more efficient way to get a response.  Please allow 48 business hours for a response.  Please remember that this is for non-urgent requests.   Thank you for choosing me and [facility].  Vito Cirigliano, D.O.

## 2023-02-03 NOTE — Progress Notes (Signed)
Chief Complaint:    Symptomatic Internal Hemorrhoids; Hemorrhoid Band Ligation  GI History: 47 year old male with history of symptomatic hemorrhoids.  Initially had issues with thrombosed external hemorrhoids. First episode was around age 3 requiring I&D. Did well for a few years, but sxs recurred 2-3 years later, again requiring I&D. Another episode 3 years later, again requiring I&D. In between these events would have minor hemorrhoidal sxs that resolved with conservative management.    In 08/2022, sxs started to recur in the setting of constipation.  Constipation resolved quickly, but anorectal symptoms persisted for several weeks, treated with conservative management.  -10/28/2022: Evaluated by Dr. Cliffton Asters in Colorectal Surgery clinic. Anorectal exam without significant external hemorrhoid component, soft decompressed tags. Anoscopy with healthy appearing anoderm, small internal hemorrhoid components without ulceration, fissure, granulation tissue. - 12/29/2022: Initial appointment in the GI clinic.  Continued hemorrhoidal symptoms affecting his QOL  -01/29/2023: Colonoscopy: 8 mm ascending colon polyp, 2 polyps found in the sigmoid and splenic flexure measuring 2-4 mm.  Path pending.  Medium size internal hemorrhoids.  Normal TI  HPI:     Patient is a 47 y.o. malewith a history of symptomatic internal hemorrhoids presenting to the Gastroenterology Clinic for follow-up and ongoing treatment. The patient presents with symptomatic grade 2 hemorrhoids, unresponsive to maximal medical therapy, requesting rubber band ligation of symptomatic hemorrhoidal disease.  No change in medical or surgical history, medications, allergies, social history since last appointment with me.   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     lidocaine-prilocaine (EMLA) cream Apply three times a day as needed for relief (Patient not taking: Reported on 12/29/2022)     No current facility-administered medications for this visit.    Physical Exam:     There were no vitals taken for this visit.  GENERAL:  Pleasant male in NAD PSYCH: Cooperative, normal affect NEURO: Alert and oriented x 3, no focal neurologic deficits Rectal exam: Sensation intact and preserved anal wink.  Grade 2 hemorrhoids noted in all positions on exam.  No external anal fissures noted. Normal sphincter tone. No palpable mass. No blood on the exam glove. (Chaperone: Ailene Rud, CMA).   IMPRESSION and PLAN:    #1.  Symptomatic internal hemorrhoids: PROCEDURE NOTE: The patient presents with symptomatic grade 2 hemorrhoids, unresponsive to maximal medical therapy, requesting rubber band ligation of symptomatic hemorrhoidal disease.  All risks, benefits and alternative forms of therapy were described and informed consent was obtained.  In the Left Lateral Decubitus position, anoscopic examination revealed grade 2 hemorrhoids in the all position(s).  The anorectum was pre-medicated with RectiCare. The decision was made to band the RP internal hemorrhoid, and the [facility] System was used to perform band ligation without complication.  Digital anorectal examination was then performed to assure proper positioning of the band, and to adjust the banded tissue as required.  The patient was discharged home without pain or other issues.  Dietary and behavioral recommendations were given and along with follow-up instructions.     The following adjunctive treatments were recommended:  -  Resume high-fiber diet with fiber supplement (i.e. Citrucel or  Benefiber) with goal for soft stools without straining to have a BM. -Resume adequate fluid intake.  The patient will return in 4+ for follow-up and possible additional banding as required. No complications were encountered and the patient tolerated the procedure well.      #2.  History of colon polyps - Colonoscopy last week with 3 subcentimeter polyps.  Path still pending.  Will determine timing for repeat colonoscopy pending path results.      Shellia Cleverly ,DO, FACG 02/03/2023, 3:43 PM

## 2023-02-04 ENCOUNTER — Encounter: Payer: Self-pay | Admitting: Gastroenterology

## 2023-02-07 DIAGNOSIS — Z86010 Personal history of colonic polyps: Secondary | ICD-10-CM | POA: Insufficient documentation

## 2023-02-08 ENCOUNTER — Telehealth: Payer: Self-pay | Admitting: Gastroenterology

## 2023-02-08 NOTE — Telephone Encounter (Signed)
Inbound call from patient , stated she had a hem banding schedule bt Dr.Cirigiliano told him he can have the surgery instead and he want to know how can he have surgery .Please advise .

## 2023-02-08 NOTE — Telephone Encounter (Signed)
Returned call to patient. I informed patient that he has been evaluated by Dr. Cliffton Asters with [facility] in the past (10/28/22) and he should call them to set up a f/u appt to discuss possible hemorrhoidectomy. I informed patient that we do not perform surgeries at this office. [facility] can see his records from our office, he does not need referral since he is already an established pt with Dr. Cliffton Asters. I provided patient with the contact information to [facility]. Pt verbalized understanding and had no concerns at the end of the call.

## 2023-02-23 ENCOUNTER — Other Ambulatory Visit: Payer: Self-pay

## 2023-02-23 ENCOUNTER — Encounter (HOSPITAL_COMMUNITY): Payer: Self-pay | Admitting: Surgery

## 2023-02-23 NOTE — Progress Notes (Signed)
Spoke with pt for pre-op call. Pt denies cardiac history, HTN or Diabetes.  Shower instructions given to pt and he voiced understanding. 

## 2023-02-24 ENCOUNTER — Ambulatory Visit: Payer: Self-pay | Admitting: Surgery

## 2023-02-25 ENCOUNTER — Encounter (HOSPITAL_COMMUNITY): Payer: Self-pay | Admitting: Surgery

## 2023-02-25 ENCOUNTER — Ambulatory Visit (HOSPITAL_BASED_OUTPATIENT_CLINIC_OR_DEPARTMENT_OTHER): Payer: Commercial Managed Care - HMO | Admitting: Anesthesiology

## 2023-02-25 ENCOUNTER — Ambulatory Visit (HOSPITAL_COMMUNITY): Payer: Commercial Managed Care - HMO | Admitting: Anesthesiology

## 2023-02-25 ENCOUNTER — Other Ambulatory Visit: Payer: Self-pay

## 2023-02-25 ENCOUNTER — Ambulatory Visit (HOSPITAL_COMMUNITY)
Admission: RE | Admit: 2023-02-25 | Discharge: 2023-02-25 | Disposition: A | Discharge status: Home / Self Care | Payer: Commercial Managed Care - HMO | Attending: Surgery | Admitting: Surgery

## 2023-02-25 ENCOUNTER — Encounter (HOSPITAL_COMMUNITY)
Admission: RE | Disposition: A | Discharge status: Home / Self Care | Payer: Self-pay | Source: Home / Self Care | Attending: Surgery

## 2023-02-25 DIAGNOSIS — F419 Anxiety disorder, unspecified: Secondary | ICD-10-CM | POA: Insufficient documentation

## 2023-02-25 DIAGNOSIS — K649 Unspecified hemorrhoids: Secondary | ICD-10-CM | POA: Diagnosis not present

## 2023-02-25 DIAGNOSIS — K644 Residual hemorrhoidal skin tags: Secondary | ICD-10-CM | POA: Diagnosis not present

## 2023-02-25 DIAGNOSIS — K648 Other hemorrhoids: Secondary | ICD-10-CM | POA: Diagnosis not present

## 2023-02-25 DIAGNOSIS — Z87891 Personal history of nicotine dependence: Secondary | ICD-10-CM

## 2023-02-25 DIAGNOSIS — Z86010 Personal history of colonic polyps: Secondary | ICD-10-CM | POA: Insufficient documentation

## 2023-02-25 HISTORY — PX: EVALUATION UNDER ANESTHESIA WITH HEMORRHOIDECTOMY: SHX5624

## 2023-02-25 LAB — CBC
HCT: 39.6 % (ref 39.0–52.0)
Hemoglobin: 14.1 g/dL (ref 13.0–17.0)
MCH: 31 pg (ref 26.0–34.0)
MCHC: 35.6 g/dL (ref 30.0–36.0)
MCV: 87 fL (ref 80.0–100.0)
Platelets: 200 10*3/uL (ref 150–400)
RBC: 4.55 MIL/uL (ref 4.22–5.81)
RDW: 12.5 % (ref 11.5–15.5)
WBC: 9 10*3/uL (ref 4.0–10.5)
nRBC: 0 % (ref 0.0–0.2)

## 2023-02-25 SURGERY — EXAM UNDER ANESTHESIA WITH HEMORRHOIDECTOMY
Anesthesia: General

## 2023-02-25 MED ORDER — ONDANSETRON HCL 4 MG/2ML IJ SOLN
INTRAMUSCULAR | Status: DC | PRN
  Administered 2023-02-25: 4 mg via INTRAVENOUS

## 2023-02-25 MED ORDER — ORAL CARE MOUTH RINSE: 15.0000 mL | Freq: Once | OROMUCOSAL | Status: AC

## 2023-02-25 MED ORDER — FENTANYL CITRATE (PF) 100 MCG/2ML IJ SOLN: 25.0000 ug | INTRAMUSCULAR | Status: DC | PRN

## 2023-02-25 MED ORDER — FLEET ENEMA 7-19 GM/118ML RE ENEM
1.0000 | ENEMA | Freq: Once | RECTAL | Status: AC
  Administered 2023-02-25: 1 via RECTAL
  Filled 2023-02-25: qty 1

## 2023-02-25 MED ORDER — PROPOFOL 10 MG/ML IV BOLUS
INTRAVENOUS | Status: AC
  Filled 2023-02-25: qty 20

## 2023-02-25 MED ORDER — 0.9 % SODIUM CHLORIDE (POUR BTL) OPTIME
TOPICAL | Status: DC | PRN
  Administered 2023-02-25: 1000 mL

## 2023-02-25 MED ORDER — TRAMADOL HCL 50 MG PO TABS: 50.0000 mg | ORAL_TABLET | Freq: Four times a day (QID) | ORAL | 0 refills | Status: AC | PRN

## 2023-02-25 MED ORDER — PROPOFOL 10 MG/ML IV BOLUS
INTRAVENOUS | Status: DC | PRN
  Administered 2023-02-25: 200 mg via INTRAVENOUS

## 2023-02-25 MED ORDER — BUPIVACAINE LIPOSOME 1.3 % IJ SUSP
INTRAMUSCULAR | Status: AC
  Filled 2023-02-25: qty 20

## 2023-02-25 MED ORDER — FENTANYL CITRATE (PF) 250 MCG/5ML IJ SOLN
INTRAMUSCULAR | Status: AC
  Filled 2023-02-25: qty 5

## 2023-02-25 MED ORDER — KETOROLAC TROMETHAMINE 30 MG/ML IJ SOLN: 30.0000 mg | Freq: Once | INTRAMUSCULAR | Status: DC | PRN

## 2023-02-25 MED ORDER — MIDAZOLAM HCL 2 MG/2ML IJ SOLN
INTRAMUSCULAR | Status: AC
  Filled 2023-02-25: qty 2

## 2023-02-25 MED ORDER — ROCURONIUM BROMIDE 10 MG/ML (PF) SYRINGE
PREFILLED_SYRINGE | INTRAVENOUS | Status: DC | PRN
  Administered 2023-02-25: 60 mg via INTRAVENOUS

## 2023-02-25 MED ORDER — BUPIVACAINE-EPINEPHRINE (PF) 0.25% -1:200000 IJ SOLN
INTRAMUSCULAR | Status: DC | PRN
  Administered 2023-02-25: 50 mL

## 2023-02-25 MED ORDER — CHLORHEXIDINE GLUCONATE 0.12 % MT SOLN
15.0000 mL | Freq: Once | OROMUCOSAL | Status: AC
  Administered 2023-02-25: 15 mL via OROMUCOSAL
  Filled 2023-02-25: qty 15

## 2023-02-25 MED ORDER — LACTATED RINGERS IV SOLN: INTRAVENOUS | Status: DC

## 2023-02-25 MED ORDER — ACETAMINOPHEN 500 MG PO TABS
1000.0000 mg | ORAL_TABLET | ORAL | Status: AC
  Administered 2023-02-25: 1000 mg via ORAL
  Filled 2023-02-25: qty 2

## 2023-02-25 MED ORDER — LIDOCAINE 2% (20 MG/ML) 5 ML SYRINGE
INTRAMUSCULAR | Status: DC | PRN
  Administered 2023-02-25: 100 mg via INTRAVENOUS

## 2023-02-25 MED ORDER — FENTANYL CITRATE (PF) 250 MCG/5ML IJ SOLN
INTRAMUSCULAR | Status: DC | PRN
  Administered 2023-02-25: 100 ug via INTRAVENOUS

## 2023-02-25 MED ORDER — CHLORHEXIDINE GLUCONATE CLOTH 2 % EX PADS: 6.0000 | MEDICATED_PAD | Freq: Once | CUTANEOUS | Status: DC

## 2023-02-25 MED ORDER — ONDANSETRON HCL 4 MG/2ML IJ SOLN: 4.0000 mg | Freq: Once | INTRAMUSCULAR | Status: DC | PRN

## 2023-02-25 MED ORDER — BUPIVACAINE LIPOSOME 1.3 % IJ SUSP: 20.0000 mL | Freq: Once | INTRAMUSCULAR | Status: DC

## 2023-02-25 MED ORDER — DEXAMETHASONE SODIUM PHOSPHATE 10 MG/ML IJ SOLN
INTRAMUSCULAR | Status: DC | PRN
  Administered 2023-02-25: 8 mg via INTRAVENOUS

## 2023-02-25 MED ORDER — FLEET ENEMA 7-19 GM/118ML RE ENEM
1.0000 | ENEMA | Freq: Once | RECTAL | Status: DC
  Filled 2023-02-25: qty 1

## 2023-02-25 MED ORDER — MIDAZOLAM HCL 2 MG/2ML IJ SOLN
INTRAMUSCULAR | Status: DC | PRN
  Administered 2023-02-25: 2 mg via INTRAVENOUS

## 2023-02-25 MED ORDER — BUPIVACAINE-EPINEPHRINE (PF) 0.25% -1:200000 IJ SOLN
INTRAMUSCULAR | Status: AC
  Filled 2023-02-25: qty 30

## 2023-02-25 MED ORDER — OXYCODONE HCL 5 MG PO TABS: 5.0000 mg | ORAL_TABLET | Freq: Once | ORAL | Status: DC | PRN

## 2023-02-25 MED ORDER — OXYCODONE HCL 5 MG/5ML PO SOLN: 5.0000 mg | Freq: Once | ORAL | Status: DC | PRN

## 2023-02-25 MED ORDER — SUGAMMADEX SODIUM 200 MG/2ML IV SOLN
INTRAVENOUS | Status: DC | PRN
  Administered 2023-02-25: 186.8 mg via INTRAVENOUS

## 2023-02-25 SURGICAL SUPPLY — 36 items
BAG COUNTER SPONGE SURGICOUNT (BAG) ×2 IMPLANT
BAG SPNG CNTER NS LX DISP (BAG) ×1
BLADE SURG 15 STRL LF DISP TIS (BLADE) ×2 IMPLANT
BLADE SURG 15 STRL SS (BLADE) ×1
BRIEF MESH DISP LRG (UNDERPADS AND DIAPERS) ×2 IMPLANT
CANISTER SUCT 3000ML PPV (MISCELLANEOUS) ×2 IMPLANT
COVER SURGICAL LIGHT HANDLE (MISCELLANEOUS) ×2 IMPLANT
DISSECTOR SURG LIGASURE 21 (MISCELLANEOUS) IMPLANT
DRAPE LAPAROTOMY 100X72 PEDS (DRAPES) IMPLANT
ELECT CAUTERY BLADE 6.4 (BLADE) ×2 IMPLANT
GAUZE PAD ABD 8X10 STRL (GAUZE/BANDAGES/DRESSINGS) ×2 IMPLANT
GAUZE SPONGE 4X4 12PLY STRL (GAUZE/BANDAGES/DRESSINGS) ×2 IMPLANT
GLOVE BIO SURGEON STRL SZ7.5 (GLOVE) ×2 IMPLANT
GLOVE INDICATOR 8.0 STRL GRN (GLOVE) ×2 IMPLANT
GOWN STRL REUS W/ TWL LRG LVL3 (GOWN DISPOSABLE) ×2 IMPLANT
GOWN STRL REUS W/ TWL XL LVL3 (GOWN DISPOSABLE) ×2 IMPLANT
GOWN STRL REUS W/TWL LRG LVL3 (GOWN DISPOSABLE) ×1
GOWN STRL REUS W/TWL XL LVL3 (GOWN DISPOSABLE) ×1
KIT BASIN OR (CUSTOM PROCEDURE TRAY) ×2 IMPLANT
KIT TURNOVER KIT B (KITS) ×2 IMPLANT
NDL HYPO 25GX1X1/2 BEV (NEEDLE) ×2 IMPLANT
NEEDLE HYPO 25GX1X1/2 BEV (NEEDLE) ×1 IMPLANT
NS IRRIG 1000ML POUR BTL (IV SOLUTION) ×2 IMPLANT
PACK BASIC III (CUSTOM PROCEDURE TRAY) ×1
PACK LITHOTOMY IV (CUSTOM PROCEDURE TRAY) ×2 IMPLANT
PACK SRG BSC III STRL LF ECLPS (CUSTOM PROCEDURE TRAY) IMPLANT
PAD ABD 8X10 STRL (GAUZE/BANDAGES/DRESSINGS) IMPLANT
PAD ARMBOARD 7.5X6 YLW CONV (MISCELLANEOUS) ×2 IMPLANT
PENCIL BUTTON HOLSTER BLD 10FT (ELECTRODE) ×2 IMPLANT
SPONGE T-LAP 4X18 ~~LOC~~+RFID (SPONGE) ×2 IMPLANT
SURGILUBE 2OZ TUBE FLIPTOP (MISCELLANEOUS) ×2 IMPLANT
SUT CHROMIC 3 0 SH 27 (SUTURE) ×2 IMPLANT
SYR CONTROL 10ML LL (SYRINGE) ×2 IMPLANT
TOWEL GREEN STERILE (TOWEL DISPOSABLE) ×2 IMPLANT
TUBE CONNECTING 12X1/4 (SUCTIONS) ×2 IMPLANT
YANKAUER SUCT BULB TIP NO VENT (SUCTIONS) ×2 IMPLANT

## 2023-02-25 NOTE — H&P (Signed)
CC: Here today for surgery  HPI: Theodore Sanchez is an 47 y.o. male with no reported history, whom is seen in the office today as a referral by Dr. Seymour Bars for evaluation of possible hemorrhoids.  Reports a history dating back over the last 8 to 10 years of occasional tissue prolapse and discomfort. 3 prior "attacks" where he had the swelling and pain in the perianal area that was presumed to be related to a thrombosed hemorrhoid. The first one happened in his mid 30s and was quite uncomfortable for the period of a few weeks to months. Gradually improved. Currently, denies any active anal pain. He is aware of the sensation that something is slightly uncomfortable particularly if he strains or works out.  He takes a stool softener every evening-described to be Colace. He drinks less than 64 ounces of water a day currently. Spends less than 3 minutes on the commode. Reports he has a soft bowel movement daily. Occasional straining.  He denies any prior anorectal surgeries or procedures  INTERVAL HX Colonoscopy with Dr. Barron Alvine 01/29/2023: - 8 mm polyp in the ascending colon, removed - Two 2 - 4 mm polyps in the sigmoid and splenic flexure removed - Nonbleeding internal hemorrhoids - Examined portion of ileum normal  PATH - Tubular adenomas, no dysplasia  He has been reliably taking fiber and keeping his stools nice and soft. Staying hydrated. Minimizing commode time to 2 to 3 minutes. Despite that, he has had ongoing issues with occasional tissue prolapse, pain, and a recent thrombosis. He has been following with GI and has been banded once. He does not feel that he is really improved and again had a thrombosis following all this on an external component and comes back to see [place] today to discuss surgery.  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    Past Surgical History:  Procedure Laterality Date   ANTERIOR CRUCIATE LIGAMENT REPAIR     2009    ANTERIOR CRUCIATE LIGAMENT REPAIR Right    dog bite surgery      age 13 - plastic surgery on face    EYE SURGERY     knife in eye surgery- left eye    EYE SURGERY     right eye- due to dirt in eye    ORIF PATELLA Right 09/19/2013   Procedure: OPEN REDUCTION INTERNAL (ORIF) FIXATION RIGHT KNEE PATELLA;  Surgeon: Eugenia Mcalpine, MD;  Location: [facility];  Service: Orthopedics;  Laterality: Right;   PATELLA FRACTURE SURGERY Right     Family History  Problem Relation Age of Onset   Breast cancer Mother    Cancer Father    CAD Other     Social:  reports that he has quit smoking. His smoking use included cigarettes. He has been exposed to tobacco smoke. His smokeless tobacco use includes snuff and chew. He reports that he does not currently use drugs after having used the following drugs: Marijuana. He reports that he does not drink alcohol.  Allergies: No Known Allergies  Medications: I have reviewed the patient's current medications.  Results for orders placed or performed during the hospital encounter of 02/25/23 (from the past 48 hour(s))  CBC per protocol  Status: None   Collection Time: 02/25/23  6:21 AM  Result Value Ref Range   WBC 9.0 4.0 - 10.5 K/uL   RBC 4.55 4.22 - 5.81 MIL/uL   Hemoglobin 14.1 13.0 - 17.0 g/dL   HCT 44.0 34.7 - 42.5 %   MCV 87.0 80.0 - 100.0 fL   MCH 31.0 26.0 - 34.0 pg   MCHC 35.6 30.0 - 36.0 g/dL   RDW 95.6 38.7 - 56.4 %   Platelets 200 150 - 400 K/uL   nRBC 0.0 0.0 - 0.2 %    Comment: Performed at [facility], [address] [place], [place] [address]    No results found.  ROS - all of the below systems have been reviewed with the patient and positives are indicated with bold text General: chills, fever  or night sweats Eyes: blurry vision or double vision ENT: epistaxis or sore throat Allergy/Immunology: itchy/watery eyes or nasal congestion Hematologic/Lymphatic: bleeding problems, blood clots or swollen lymph nodes Endocrine: temperature intolerance or unexpected weight changes Breast: new or changing breast lumps or nipple discharge Resp: cough, shortness of breath, or wheezing CV: chest pain or dyspnea on exertion GI: as per HPI GU: dysuria, trouble voiding, or hematuria MSK: joint pain or joint stiffness Neuro: TIA or stroke symptoms Derm: pruritus and skin lesion changes Psych: anxiety and depression  PE Blood pressure (!) 136/92, pulse (!) 56, temperature 98.2 F (36.8 C), temperature source Oral, resp. rate 17, height 5\' 11"  (1.803 m), weight 93.4 kg, SpO2 98 %. Constitutional: NAD; conversant Eyes: Moist conjunctiva Lungs: Normal respiratory effort CV: RRR MSK: Normal range of motion of extremities Psychiatric: Appropriate affect  Results for orders placed or performed during the hospital encounter of 02/25/23 (from the past 48 hour(s))  CBC per protocol     Status: None   Collection Time: 02/25/23  6:21 AM  Result Value Ref Range   WBC 9.0 4.0 - 10.5 K/uL   RBC 4.55 4.22 - 5.81 MIL/uL   Hemoglobin 14.1 13.0 - 17.0 g/dL   HCT 18.8 41.6 - 60.6 %   MCV 87.0 80.0 - 100.0 fL   MCH 31.0 26.0 - 34.0 pg   MCHC 35.6 30.0 - 36.0 g/dL   RDW 30.1 60.1 - 09.3 %   Platelets 200 150 - 400 K/uL   nRBC 0.0 0.0 - 0.2 %    Comment: Performed at [facility], [address], [place], [place] [address]    No results found.    A/P: Theodore Sanchez is an 47 y.o. male here for surgery for hemorrhoids  -Cscope with Dr. Barron Alvine 01/2023 -Despite optimization with fiber, water, minimizing commode times, his symptoms have persisted. He returns today as he is motivated to pursue hemorrhoidectomy. He is not thrilled with having multiple banding sessions and remains concerned  about the external component where he had a recent thrombosis.  -The anatomy and physiology of the anal canal was discussed with him. The pathophysiology of hemorrhoids was discussed as well -We have reviewed options going forward including further observation vs surgery -hemorrhoidectomy; anorectal exam under anesthesia -The planned procedure, material risks (including, but not limited to, pain, bleeding, infection, scarring, need for blood transfusion, damage to anal sphincter, incontinence of gas and/or stool, need for additional procedures, anal stenosis, rare cases of pelvic sepsis which in severe cases may require things like a colostomy, recurrence, pneumonia, heart attack, stroke, death) benefits and alternatives to surgery were discussed at length. I noted  a good probability that the procedure would help improve their symptoms. The patient's questions were answered to his satisfaction, he voiced understanding and elected to proceed with surgery. Additionally, we discussed typical postoperative expectations and the recovery process. We discussed pain expectations following surgery including a fairly significant amount discomfort being possible for about 2 weeks after surgery.   Marin Olp, MD [facility], [facility]

## 2023-02-25 NOTE — Anesthesia Postprocedure Evaluation (Signed)
Anesthesia Post Note  Patient: Theodore Sanchez  Procedure(Sanchez) Performed: EXAM UNDER ANESTHESIA WITH HEMORRHOIDECTOMY     Patient location during evaluation: PACU Anesthesia Type: General Level of consciousness: awake and alert Pain management: pain level controlled Vital Signs Assessment: post-procedure vital signs reviewed and stable Respiratory status: spontaneous breathing, nonlabored ventilation, respiratory function stable and patient connected to nasal cannula oxygen Cardiovascular status: blood pressure returned to baseline and stable Postop Assessment: no apparent nausea or vomiting Anesthetic complications: no  No notable events documented.  Last Vitals:  Vitals:   02/25/23 0845 02/25/23 0900  BP: 130/84   Pulse: (!) 53 (!) 50  Resp: 18 15  Temp:    SpO2: 96% 96%    Last Pain:  Vitals:   02/25/23 0825  TempSrc:   PainSc: 0-No pain                 Theodore Sanchez

## 2023-02-25 NOTE — Anesthesia Procedure Notes (Signed)
Procedure Name: Intubation Date/Time: 02/25/2023 7:36 AM  Performed by: Randon Goldsmith, CRNAPre-anesthesia Checklist: Patient identified, Emergency Drugs available, Suction available and Patient being monitored Patient Re-evaluated:Patient Re-evaluated prior to induction Oxygen Delivery Method: Circle system utilized Preoxygenation: Pre-oxygenation with 100% oxygen Induction Type: IV induction Ventilation: Mask ventilation without difficulty Laryngoscope Size: Mac and 4 Grade View: Grade II Tube type: Oral Tube size: 7.5 mm Number of attempts: 1 Airway Equipment and Method: Stylet and Oral airway Placement Confirmation: ETT inserted through vocal cords under direct vision, positive ETCO2 and breath sounds checked- equal and bilateral Secured at: 22 cm Tube secured with: Tape Dental Injury: Teeth and Oropharynx as per pre-operative assessment

## 2023-02-25 NOTE — Transfer of Care (Signed)
Immediate Anesthesia Transfer of Care Note  Patient: Daryon B [place]  Procedure(s) Performed: EXAM UNDER ANESTHESIA WITH HEMORRHOIDECTOMY  Patient Location: PACU  Anesthesia Type:General  Level of Consciousness: awake, alert , and oriented  Airway & Oxygen Therapy: Patient Spontanous Breathing  Post-op Assessment: Report given to RN and Post -op Vital signs reviewed and stable  Post vital signs: Reviewed and stable  Last Vitals:  Vitals Value Taken Time  BP 144/83 02/25/23 0824  Temp    Pulse 83 02/25/23 0826  Resp 17 02/25/23 0826  SpO2 100 % 02/25/23 0826  Vitals shown include unvalidated device data.  Last Pain:  Vitals:   02/25/23 0620  TempSrc:   PainSc: 0-No pain         Complications: No notable events documented.

## 2023-02-25 NOTE — Op Note (Signed)
02/25/2023  8:14 AM  PATIENT:  Theodore Sanchez  47 y.o. male  Patient Care Team: [name], MD as PCP - General (Internal Medicine) Shellia Cleverly, DO as Consulting Physician (Gastroenterology)  PRE-OPERATIVE DIAGNOSIS:  Hemorrhoids, medically refractory  POST-OPERATIVE DIAGNOSIS:  Same  PROCEDURE:   Hemorrhoidectomy Anorectal exam under anesthesia  SURGEON:  Surgeon(s): Andria Meuse, MD  ANESTHESIA:   local and general  SPECIMEN:  Right anterior hemorrhoidal tissue  DISPOSITION OF SPECIMEN:  PATHOLOGY  COUNTS:  Sponge, needle, and instrument counts were reported correct x2 at conclusion.  EBL: 5 mL  Drains: None  PLAN OF CARE: Discharge to home after PACU  PATIENT DISPOSITION:  PACU - hemodynamically stable.  OR FINDINGS:   DESCRIPTION: The patient was identified in the preoperative holding area and taken to the OR. SCDs were applied.  He then underwent general endotracheal anesthesia without difficulty. The patient was then rolled onto the OR table in the prone jackknife position. Pressure points were then evaluated and padded. Benzoin was applied to the buttocks and they were gently taped apart.  He was then prepped and draped in usual sterile fashion.  A surgical timeout was performed indicating the correct patient, procedure, and positioning.  A perianal block was then created using a dilute mixture of 0.25% Marcaine with epinephrine and Exparel.  After ascertaining an appropriate level of anesthesia had been achieved, a well lubricated digital rectal exam was performed. This demonstrated no palpable abnormalities.  A Hill-Ferguson anoscope was into the anal canal and circumferential inspection demonstrated healthy appearing anoderm without fissures or granulation tissue.  Mixed internal/external hemorrhoids with the bulk being in the right lateral and right anterior position.  There is no other significant hemorrhoidal tissue within the anal  canal.  Attention is directed at hemorrhoidectomy in the right anterior and right lateral positions.  These were all confluent with 1 another and therefore incorporated as 1 single column of hemorrhoidectomy.  The external and internal hemorrhoidal tissue are grasped with a DeBakey forcep and elevated.  The skin is then incised sharply.  Were able to dissect the external sphincter muscle free of the hemorrhoidal tissue overlying it.  The internal hemorrhoidal tissue was also excised in a similar manner sharply but the pedicle of which is ligated and divided using the hand-held LigaSure device.  Hemorrhoidal tissue was passed off the specimen-right anterior hemorrhoidal tissue.  The hemorrhoidal pedicle was then suture-ligated using a 2-0 Vicryl figure-of-eight suture.  The hemorrhoidal defect is then closed using a running 2-0 Vicryl suture.  The anal canal was then irrigated.  Hemostasis is verified.  Circumferential reinspection demonstrates no other significant hemorrhoidal tissue present within the anal canal.  Additional local anesthetic is infiltrated at the hemorrhoidectomy site.  All sponge, needle, and instrument counts were reported correct.  The buttocks are untaped.  A dressing consisting of 4 x 4's, ABD, mesh underwear was placed.  He was rolled back onto a stretcher, awakened from anesthesia, extubated, transported to recovery in satisfactory condition.  DISPOSITION: PACU in satisfactory condition.

## 2023-02-25 NOTE — Anesthesia Preprocedure Evaluation (Signed)
Anesthesia Evaluation  Patient identified by MRN, date of birth, ID band Patient awake    Reviewed: Allergy & Precautions, H&P , NPO status , Patient's Chart, lab work & pertinent test results  Airway Mallampati: II  TM Distance: >3 FB Neck ROM: Full    Dental no notable dental hx.    Pulmonary neg pulmonary ROS, former smoker   Pulmonary exam normal breath sounds clear to auscultation       Cardiovascular negative cardio ROS Normal cardiovascular exam Rhythm:Regular Rate:Normal     Neuro/Psych   Anxiety     negative neurological ROS     GI/Hepatic negative GI ROS, Neg liver ROS,,,  Endo/Other  negative endocrine ROS    Renal/GU negative Renal ROS  negative genitourinary   Musculoskeletal negative musculoskeletal ROS (+)    Abdominal   Peds negative pediatric ROS (+)  Hematology negative hematology ROS (+)   Anesthesia Other Findings   Reproductive/Obstetrics negative OB ROS                             Anesthesia Physical Anesthesia Plan  ASA: 2  Anesthesia Plan: General   Post-op Pain Management: Minimal or no pain anticipated   Induction: Intravenous  PONV Risk Score and Plan: 2 and Ondansetron, Dexamethasone and Treatment may vary due to age or medical condition  Airway Management Planned: LMA  Additional Equipment:   Intra-op Plan:   Post-operative Plan: Extubation in OR  Informed Consent: I have reviewed the patients History and Physical, chart, labs and discussed the procedure including the risks, benefits and alternatives for the proposed anesthesia with the patient or authorized representative who has indicated his/her understanding and acceptance.     Dental advisory given  Plan Discussed with: CRNA and Surgeon  Anesthesia Plan Comments:        Anesthesia Quick Evaluation

## 2023-02-25 NOTE — Discharge Instructions (Signed)
ANORECTAL SURGERY: POST OP INSTRUCTIONS  DIET: Follow a light bland diet the first 24 hours after arrival home, such as soup, liquids, crackers, etc.  Be sure to include lots of fluids daily.  Avoid fast food or heavy meals as your are more likely to get nauseated.  Eat a low fat diet the next few days after surgery.   Some bleeding with bowel movements is expected for the first couple of days but this should stop in between bowel movements  Take your usually prescribed home medications unless otherwise directed. No foreign bodies per rectum for the next 3 months (enemas, etc)  PAIN CONTROL: It is helpful to take an over-the-counter pain medication regularly for the first few days/weeks.  Choose from the following that works best for you: Ibuprofen (Advil, etc) Three 200mg tabs every 6 hours as needed. Acetaminophen (Tylenol, etc) 500-650mg every 6 hours as needed NOTE: You may take both of these medications together - most patients find it most helpful when alternating between the two (i.e. Ibuprofen at 6am, tylenol at 9am, ibuprofen at 12pm ...) A  prescription for pain medication may have been prescribed for you at discharge.  Take your pain medication as prescribed.  If you are having problems/concerns with the prescription medicine, please call us for further advice.  Avoid getting constipated.  Between the surgery and the pain medications, it is common to experience some constipation.  Increasing fluid intake (64oz of water per day) and taking a fiber supplement (such as Metamucil, Citrucel, FiberCon) 1-2 times a day regularly will usually help prevent this problem from occurring.  Take Miralax (over the counter) 1-2x/day while taking a narcotic pain medication. If no bowel movement after 48hours, you may additionally take a laxative like a bottle of Milk of Magnesia which can be purchased over the counter. Avoid enemas.   Watch out for diarrhea.  If you have many loose bowel movements,  simplify your diet to bland foods.  Stop any stool softeners and decrease your fiber supplement. If this worsens or does not improve, please call us.  Wash / shower every day.  If you were discharged with a dressing, you may remove this the day after your surgery. You may shower normally, getting soap/water on your wound, particularly after bowel movements.  Soaking in a warm bath filled a couple inches ("Sitz bath") is a great way to clean the area after a bowel movement and many patients find it is a way to soothe the area.  ACTIVITIES as tolerated:   You may resume regular (light) daily activities beginning the next day--such as daily self-care, walking, climbing stairs--gradually increasing activities as tolerated.  If you can walk 30 minutes without difficulty, it is safe to try more intense activity such as jogging, treadmill, bicycling, low-impact aerobics, etc. Refrain from any heavy lifting or straining for the first 2 weeks after your procedure, particularly if your surgery was for hemorrhoids. Avoid activities that make your pain worse You may drive when you are no longer taking prescription pain medication, you can comfortably wear a seatbelt, and you can safely maneuver your car and apply brakes.  FOLLOW UP in our office Please call [facility] at [phone] to set up an appointment to see your surgeon in the office for a follow-up appointment approximately 2 weeks after your surgery. Make sure that you call for this appointment the day you arrive home to insure a convenient appointment time.  9. If you have disability or family leave forms   that need to be completed, you may have them completed by your primary care physician's office; for return to work instructions, please ask our office staff and they will be happy to assist you in obtaining this documentation   When to call us [phone]: Poor pain control Reactions / problems with new medications (rash/itching, etc)  Fever over  101.5 F (38.5 C) Inability to urinate Nausea/vomiting Worsening swelling or bruising Continued bleeding from incision. Increased pain, redness, or drainage from the incision  The clinic staff is available to answer your questions during regular business hours (8:30am-5pm).  Please don't hesitate to call and ask to speak to one of our nurses for clinical concerns.   A surgeon from [facility] is always on call at the hospitals   If you have a medical emergency, go to the nearest emergency room or call 911.   [facility] [address], [address], [place], [place]  [address] MAIN: [phone] FAX: [phone] [email or web address] 

## 2023-02-26 ENCOUNTER — Encounter (HOSPITAL_COMMUNITY): Payer: Self-pay | Admitting: Surgery

## 2023-03-01 LAB — SURGICAL PATHOLOGY

## 2023-04-13 ENCOUNTER — Encounter: Payer: Commercial Managed Care - HMO | Admitting: Gastroenterology

## 2023-07-19 ENCOUNTER — Encounter (HOSPITAL_COMMUNITY): Payer: Self-pay | Admitting: Surgery

## 2023-07-19 NOTE — Progress Notes (Signed)
SDW call  Patient was given pre-op instructions over the phone. Patient verbalized understanding of instructions provided.     PCP - Dr. Glenetta Hew Cardiologist -  Pulmonary:    PPM/ICD - denies Device Orders - na Rep Notified - na   Chest x-ray - na EKG -  na Stress Test - ECHO -  Cardiac Cath -   Sleep Study/sleep apnea/CPAP: denies  Non-diabetic  Blood Thinner Instructions: denies Aspirin Instructions:denies   ERAS Protcol - Clears until 0700  COVID TEST- na    Anesthesia review: No   Patient denies shortness of breath, fever, cough and chest pain over the phone call  Your procedure is scheduled on Wednesday July 21, 2023  Report to [facility] Main Entrance "A" at  0730  A.M., then check in with the Admitting office.  Call this number if you have problems the morning of surgery:  [phone]   If you have any questions prior to your surgery date call [phone]: Open Monday-Friday 8am-4pm If you experience any cold or flu symptoms such as cough, fever, chills, shortness of breath, etc. between now and your scheduled surgery, please notify us at the above number     Remember:  Do not eat after midnight the night before your surgery  You may drink clear liquids until 0700 the morning of your surgery.   Clear liquids allowed are: Water, Non-Citrus Juices (without pulp), Carbonated Beverages, Clear Tea, Black Coffee ONLY (NO MILK, CREAM OR POWDERED CREAMER of any kind), and Gatorade   Take these medicines the morning of surgery with A SIP OF WATER: None  As of today, STOP taking any Aspirin (unless otherwise instructed by your surgeon) Aleve, Naproxen, Ibuprofen, Motrin, Advil, Goody's, BC's, all herbal medications, fish oil, and all vitamins.

## 2023-07-20 ENCOUNTER — Ambulatory Visit: Payer: Self-pay | Admitting: Surgery

## 2023-07-20 NOTE — Anesthesia Preprocedure Evaluation (Signed)
Anesthesia Evaluation  Patient identified by MRN, date of birth, ID band Patient awake    Reviewed: Allergy & Precautions, NPO status , Patient's Chart, lab work & pertinent test results  History of Anesthesia Complications Negative for: history of anesthetic complications  Airway Mallampati: I  TM Distance: >3 FB Neck ROM: Full    Dental  (+) Dental Advisory Given, Teeth Intact   Pulmonary Current Smoker and Patient abstained from smoking.   Pulmonary exam normal        Cardiovascular negative cardio ROS Normal cardiovascular exam     Neuro/Psych  PSYCHIATRIC DISORDERS Anxiety      Tourette's syndrome negative neurological ROS     GI/Hepatic negative GI ROS,,,(+)     substance abuse  marijuana use  Endo/Other  negative endocrine ROS    Renal/GU negative Renal ROS     Musculoskeletal negative musculoskeletal ROS (+)    Abdominal   Peds  Hematology negative hematology ROS (+)   Anesthesia Other Findings   Reproductive/Obstetrics                             Anesthesia Physical Anesthesia Plan  ASA: 2  Anesthesia Plan: General   Post-op Pain Management: Tylenol PO (pre-op)* and Celebrex PO (pre-op)*   Induction: Intravenous  PONV Risk Score and Plan: 2 and Treatment may vary due to age or medical condition, Ondansetron, Dexamethasone and Midazolam  Airway Management Planned: LMA  Additional Equipment: None  Intra-op Plan:   Post-operative Plan: Extubation in OR  Informed Consent: I have reviewed the patients History and Physical, chart, labs and discussed the procedure including the risks, benefits and alternatives for the proposed anesthesia with the patient or authorized representative who has indicated his/her understanding and acceptance.     Dental advisory given  Plan Discussed with: CRNA and Anesthesiologist  Anesthesia Plan Comments:        Anesthesia  Quick Evaluation

## 2023-07-21 ENCOUNTER — Ambulatory Visit (HOSPITAL_COMMUNITY)
Admission: RE | Admit: 2023-07-21 | Discharge: 2023-07-21 | Disposition: A | Discharge status: Home / Self Care | Payer: Commercial Managed Care - HMO | Attending: Surgery | Admitting: Surgery

## 2023-07-21 ENCOUNTER — Ambulatory Visit (HOSPITAL_BASED_OUTPATIENT_CLINIC_OR_DEPARTMENT_OTHER): Payer: Commercial Managed Care - HMO | Admitting: Anesthesiology

## 2023-07-21 ENCOUNTER — Other Ambulatory Visit: Payer: Self-pay

## 2023-07-21 ENCOUNTER — Ambulatory Visit (HOSPITAL_COMMUNITY): Payer: Commercial Managed Care - HMO | Admitting: Anesthesiology

## 2023-07-21 ENCOUNTER — Encounter (HOSPITAL_COMMUNITY)
Admission: RE | Disposition: A | Discharge status: Home / Self Care | Payer: Self-pay | Source: Home / Self Care | Attending: Surgery

## 2023-07-21 DIAGNOSIS — F952 Tourette's disorder: Secondary | ICD-10-CM | POA: Insufficient documentation

## 2023-07-21 DIAGNOSIS — K644 Residual hemorrhoidal skin tags: Secondary | ICD-10-CM | POA: Diagnosis present

## 2023-07-21 DIAGNOSIS — F17210 Nicotine dependence, cigarettes, uncomplicated: Secondary | ICD-10-CM | POA: Insufficient documentation

## 2023-07-21 HISTORY — PX: EXCISION OF SKIN TAG: SHX6270

## 2023-07-21 LAB — CBC
HCT: 46.9 % (ref 39.0–52.0)
Hemoglobin: 16.6 g/dL (ref 13.0–17.0)
MCH: 31.1 pg (ref 26.0–34.0)
MCHC: 35.4 g/dL (ref 30.0–36.0)
MCV: 87.8 fL (ref 80.0–100.0)
Platelets: 138 10*3/uL — ABNORMAL LOW (ref 150–400)
RBC: 5.34 MIL/uL (ref 4.22–5.81)
RDW: 12.5 % (ref 11.5–15.5)
WBC: 8.2 10*3/uL (ref 4.0–10.5)
nRBC: 0 % (ref 0.0–0.2)

## 2023-07-21 SURGERY — EXCISION, SKIN TAG
Anesthesia: General | Site: Rectum | Laterality: Right

## 2023-07-21 MED ORDER — CHLORHEXIDINE GLUCONATE CLOTH 2 % EX PADS: 6.0000 | MEDICATED_PAD | Freq: Once | CUTANEOUS | Status: DC

## 2023-07-21 MED ORDER — CHLORHEXIDINE GLUCONATE 0.12 % MT SOLN
OROMUCOSAL | Status: AC
  Administered 2023-07-21: 15 mL via OROMUCOSAL
  Filled 2023-07-21: qty 15

## 2023-07-21 MED ORDER — OXYCODONE HCL 5 MG/5ML PO SOLN: 5.0000 mg | Freq: Once | ORAL | Status: DC | PRN

## 2023-07-21 MED ORDER — ONDANSETRON HCL 4 MG/2ML IJ SOLN
INTRAMUSCULAR | Status: DC | PRN
  Administered 2023-07-21: 4 mg via INTRAVENOUS

## 2023-07-21 MED ORDER — ACETAMINOPHEN 500 MG PO TABS: 1000.0000 mg | ORAL_TABLET | ORAL | Status: AC

## 2023-07-21 MED ORDER — SODIUM CHLORIDE 0.9 % IV SOLN: 12.5000 mg | INTRAVENOUS | Status: DC | PRN

## 2023-07-21 MED ORDER — OXYCODONE HCL 5 MG PO TABS: 5.0000 mg | ORAL_TABLET | Freq: Once | ORAL | Status: DC | PRN

## 2023-07-21 MED ORDER — CELECOXIB 200 MG PO CAPS
ORAL_CAPSULE | ORAL | Status: AC
  Administered 2023-07-21: 200 mg via ORAL
  Filled 2023-07-21: qty 1

## 2023-07-21 MED ORDER — BUPIVACAINE LIPOSOME 1.3 % IJ SUSP
INTRAMUSCULAR | Status: DC | PRN
  Administered 2023-07-21: 40 mL via INTRAMUSCULAR

## 2023-07-21 MED ORDER — MIDAZOLAM HCL 2 MG/2ML IJ SOLN
INTRAMUSCULAR | Status: AC
  Filled 2023-07-21: qty 2

## 2023-07-21 MED ORDER — MIDAZOLAM HCL 2 MG/2ML IJ SOLN
INTRAMUSCULAR | Status: DC | PRN
  Administered 2023-07-21: 2 mg via INTRAVENOUS

## 2023-07-21 MED ORDER — DEXAMETHASONE SODIUM PHOSPHATE 10 MG/ML IJ SOLN
INTRAMUSCULAR | Status: AC
  Filled 2023-07-21: qty 1

## 2023-07-21 MED ORDER — BUPIVACAINE LIPOSOME 1.3 % IJ SUSP
INTRAMUSCULAR | Status: AC
Start: 2023-07-21 — End: ?
  Filled 2023-07-21: qty 20

## 2023-07-21 MED ORDER — DEXMEDETOMIDINE HCL IN NACL 80 MCG/20ML IV SOLN
INTRAVENOUS | Status: DC | PRN
  Administered 2023-07-21: 8 ug via INTRAVENOUS

## 2023-07-21 MED ORDER — LIDOCAINE 2% (20 MG/ML) 5 ML SYRINGE
INTRAMUSCULAR | Status: DC | PRN
  Administered 2023-07-21: 60 mg via INTRAVENOUS

## 2023-07-21 MED ORDER — ONDANSETRON HCL 4 MG/2ML IJ SOLN
INTRAMUSCULAR | Status: AC
  Filled 2023-07-21: qty 2

## 2023-07-21 MED ORDER — CEFAZOLIN SODIUM-DEXTROSE 2-4 GM/100ML-% IV SOLN
INTRAVENOUS | Status: AC
  Filled 2023-07-21: qty 100

## 2023-07-21 MED ORDER — 0.9 % SODIUM CHLORIDE (POUR BTL) OPTIME
TOPICAL | Status: DC | PRN
  Administered 2023-07-21: 1000 mL

## 2023-07-21 MED ORDER — GLYCOPYRROLATE PF 0.2 MG/ML IJ SOSY
PREFILLED_SYRINGE | INTRAMUSCULAR | Status: AC
  Filled 2023-07-21: qty 1

## 2023-07-21 MED ORDER — FENTANYL CITRATE (PF) 250 MCG/5ML IJ SOLN
INTRAMUSCULAR | Status: AC
  Filled 2023-07-21: qty 5

## 2023-07-21 MED ORDER — BUPIVACAINE LIPOSOME 1.3 % IJ SUSP: 20.0000 mL | Freq: Once | INTRAMUSCULAR | Status: DC

## 2023-07-21 MED ORDER — ACETAMINOPHEN 500 MG PO TABS
ORAL_TABLET | ORAL | Status: AC
  Administered 2023-07-21: 1000 mg via ORAL
  Filled 2023-07-21: qty 2

## 2023-07-21 MED ORDER — SODIUM CHLORIDE 0.9 % IV SOLN: INTRAVENOUS | Status: DC | PRN

## 2023-07-21 MED ORDER — ARTIFICIAL TEARS OPHTHALMIC OINT
TOPICAL_OINTMENT | OPHTHALMIC | Status: AC
  Filled 2023-07-21: qty 3.5

## 2023-07-21 MED ORDER — CHLORHEXIDINE GLUCONATE 0.12 % MT SOLN: 15.0000 mL | Freq: Once | OROMUCOSAL | Status: AC

## 2023-07-21 MED ORDER — LIDOCAINE 2% (20 MG/ML) 5 ML SYRINGE
INTRAMUSCULAR | Status: AC
  Filled 2023-07-21: qty 5

## 2023-07-21 MED ORDER — BUPIVACAINE HCL (PF) 0.25 % IJ SOLN
INTRAMUSCULAR | Status: AC
Start: 2023-07-21 — End: ?
  Filled 2023-07-21: qty 30

## 2023-07-21 MED ORDER — AMISULPRIDE (ANTIEMETIC) 5 MG/2ML IV SOLN: 10.0000 mg | Freq: Once | INTRAVENOUS | Status: DC | PRN

## 2023-07-21 MED ORDER — TRAMADOL HCL 50 MG PO TABS: 50.0000 mg | ORAL_TABLET | Freq: Four times a day (QID) | ORAL | 0 refills | Status: AC | PRN

## 2023-07-21 MED ORDER — PROPOFOL 10 MG/ML IV BOLUS
INTRAVENOUS | Status: DC | PRN
  Administered 2023-07-21: 300 mg via INTRAVENOUS

## 2023-07-21 MED ORDER — LACTATED RINGERS IV SOLN: INTRAVENOUS | Status: DC

## 2023-07-21 MED ORDER — DEXMEDETOMIDINE HCL IN NACL 80 MCG/20ML IV SOLN
INTRAVENOUS | Status: AC
  Filled 2023-07-21: qty 20

## 2023-07-21 MED ORDER — FENTANYL CITRATE (PF) 250 MCG/5ML IJ SOLN
INTRAMUSCULAR | Status: DC | PRN
  Administered 2023-07-21 (×2): 50 ug via INTRAVENOUS

## 2023-07-21 MED ORDER — CHLORHEXIDINE GLUCONATE CLOTH 2 % EX PADS
6.0000 | MEDICATED_PAD | Freq: Once | CUTANEOUS | Status: AC
  Administered 2023-07-21: 6 via TOPICAL

## 2023-07-21 MED ORDER — CELECOXIB 200 MG PO CAPS: 200.0000 mg | ORAL_CAPSULE | Freq: Once | ORAL | Status: AC

## 2023-07-21 MED ORDER — FENTANYL CITRATE (PF) 100 MCG/2ML IJ SOLN: 25.0000 ug | INTRAMUSCULAR | Status: DC | PRN

## 2023-07-21 MED ORDER — DEXAMETHASONE SODIUM PHOSPHATE 10 MG/ML IJ SOLN
INTRAMUSCULAR | Status: DC | PRN
  Administered 2023-07-21: 5 mg via INTRAVENOUS

## 2023-07-21 MED ORDER — GLYCOPYRROLATE PF 0.2 MG/ML IJ SOSY
PREFILLED_SYRINGE | INTRAMUSCULAR | Status: DC | PRN
  Administered 2023-07-21: .2 mg via INTRAVENOUS

## 2023-07-21 MED ORDER — FLEET ENEMA RE ENEM
1.0000 | ENEMA | Freq: Once | RECTAL | Status: DC
  Filled 2023-07-21: qty 1

## 2023-07-21 MED ORDER — CEFAZOLIN SODIUM-DEXTROSE 2-4 GM/100ML-% IV SOLN
2.0000 g | INTRAVENOUS | Status: AC
  Administered 2023-07-21: 2 g via INTRAVENOUS

## 2023-07-21 MED ORDER — ORAL CARE MOUTH RINSE: 15.0000 mL | Freq: Once | OROMUCOSAL | Status: AC

## 2023-07-21 SURGICAL SUPPLY — 30 items
BAG COUNTER SPONGE SURGICOUNT (BAG) ×2 IMPLANT
BLADE SURG 15 STRL LF DISP TIS (BLADE) ×2 IMPLANT
BLADE SURG 15 STRL SS (BLADE) ×1
BRIEF MESH DISP LRG (UNDERPADS AND DIAPERS) ×2 IMPLANT
CANISTER SUCT 3000ML PPV (MISCELLANEOUS) ×2 IMPLANT
COVER SURGICAL LIGHT HANDLE (MISCELLANEOUS) ×2 IMPLANT
ELECT CAUTERY BLADE 6.4 (BLADE) ×2 IMPLANT
GAUZE PAD ABD 8X10 STRL (GAUZE/BANDAGES/DRESSINGS) ×2 IMPLANT
GAUZE SPONGE 4X4 12PLY STRL (GAUZE/BANDAGES/DRESSINGS) ×2 IMPLANT
GLOVE BIO SURGEON STRL SZ7.5 (GLOVE) ×2 IMPLANT
GLOVE INDICATOR 8.0 STRL GRN (GLOVE) ×2 IMPLANT
GOWN STRL REUS W/ TWL LRG LVL3 (GOWN DISPOSABLE) ×2 IMPLANT
GOWN STRL REUS W/ TWL XL LVL3 (GOWN DISPOSABLE) ×2 IMPLANT
GOWN STRL REUS W/TWL LRG LVL3 (GOWN DISPOSABLE) ×1
GOWN STRL REUS W/TWL XL LVL3 (GOWN DISPOSABLE) ×1
KIT BASIN OR (CUSTOM PROCEDURE TRAY) ×2 IMPLANT
KIT TURNOVER KIT B (KITS) ×2 IMPLANT
NDL HYPO 25GX1X1/2 BEV (NEEDLE) ×2 IMPLANT
NEEDLE HYPO 25GX1X1/2 BEV (NEEDLE) ×1
NS IRRIG 1000ML POUR BTL (IV SOLUTION) ×2 IMPLANT
PACK LITHOTOMY IV (CUSTOM PROCEDURE TRAY) ×2 IMPLANT
PAD ARMBOARD 7.5X6 YLW CONV (MISCELLANEOUS) ×2 IMPLANT
PENCIL BUTTON HOLSTER BLD 10FT (ELECTRODE) ×2 IMPLANT
SPONGE T-LAP 4X18 ~~LOC~~+RFID (SPONGE) ×2 IMPLANT
SURGILUBE 2OZ TUBE FLIPTOP (MISCELLANEOUS) ×2 IMPLANT
SUT CHROMIC 3 0 SH 27 (SUTURE) ×2 IMPLANT
SYR CONTROL 10ML LL (SYRINGE) ×2 IMPLANT
TOWEL GREEN STERILE (TOWEL DISPOSABLE) ×2 IMPLANT
TUBE CONNECTING 12X1/4 (SUCTIONS) ×2 IMPLANT
YANKAUER SUCT BULB TIP NO VENT (SUCTIONS) ×2 IMPLANT

## 2023-07-21 NOTE — Transfer of Care (Signed)
Immediate Anesthesia Transfer of Care Note  Patient: Theodore Sanchez  Procedure(s) Performed: EXCISION OF PERIANAL  SKIN TAGS X2  Patient Location: PACU  Anesthesia Type:General  Level of Consciousness: awake, alert , and oriented  Airway & Oxygen Therapy: Patient Spontanous Breathing and Patient connected to face mask oxygen  Post-op Assessment: Report given to RN and Post -op Vital signs reviewed and stable  Post vital signs: Reviewed and stable  Last Vitals:  Vitals Value Taken Time  BP 109/63 07/21/23 1036  Temp 36.6 C 07/21/23 1036  Pulse 43 07/21/23 1037  Resp 9 07/21/23 1037  SpO2 99 % 07/21/23 1037  Vitals shown include unfiled device data.  Last Pain:  Vitals:   07/21/23 0831  TempSrc:   PainSc: 0-No pain         Complications: No notable events documented.

## 2023-07-21 NOTE — H&P (Signed)
CC: Here today for surgery  HPI: Theodore Sanchez is an 47 y.o. male with no reported history, whom was originally seen in the office for evaluation of possible hemorrhoids.  Reports a history dating back over the last 8 to 10 years of occasional tissue prolapse and discomfort. 3 prior "attacks" where he had the swelling and pain in the perianal area that was presumed to be related to a thrombosed hemorrhoid. The first one happened in his mid 30s and was quite uncomfortable for the period of a few weeks to months. Gradually improved. Currently, denies any active anal pain. He is aware of the sensation that something is slightly uncomfortable particularly if he strains or works out.  He takes a stool softener every evening-described to be Colace. He drinks less than 64 ounces of water a day currently. Spends less than 3 minutes on the commode. Reports he has a soft bowel movement daily. Occasional straining.  He denies any prior anorectal surgeries or procedures  Colonoscopy with Dr. Barron Alvine 01/29/2023: - 8 mm polyp in the ascending colon, removed - Two 2 - 4 mm polyps in the sigmoid and splenic flexure removed - Nonbleeding internal hemorrhoids - Examined portion of ileum normal  PATH - Tubular adenomas, no dysplasia  He has been reliably taking fiber and keeping his stools nice and soft. Staying hydrated. Minimizing commode time to 2 to 3 minutes. Despite that, he has had ongoing issues with occasional tissue prolapse, pain, and a recent thrombosis. He has been following with GI and has been banded once. He does not feel that he is really improved and again had a thrombosis following all this on an external component and comes back to see [place] today to discuss surgery.  OR 02/25/23 - hemorrhoidectomy, EUA PATH A. HEMORRHOIDS, RIGHT ANTERIOR, EXCISION:  - Dilated submucosa vessels consistent with hemorrhoids   Returns for follow-up. He has been doing well. He has been working to keep his  stool soft. He denies any constipation or straining. He is taking a fiber supplement. He has had some trouble with regards to some perianal skin tags primarily trouble with hygiene/occasional swelling particular after lifting right at the external opening of the anus and he is isolated this to be on the right side. He denies any significant pain or bleeding. He denies any pain with bowel movements. He denies any difficulty evacuating stool.  PMH: Denies  PSH: Denies any anorectal surgeries or procedures  FHx: Mother had breast cancer; otherwise denies any known family history of colorectal, breast, endometrial or ovarian cancer  Social Hx: Denies use of tobacco/EtOH/illicit drug. He is self-employed-works as a bondsman   INTERVAL HX He denies any changes in health or health history since we met in the office. No new medications/allergies. He states he is ready for surgery today.  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    Past Surgical History:  Procedure Laterality Date   ANTERIOR CRUCIATE LIGAMENT REPAIR     2009    ANTERIOR CRUCIATE LIGAMENT REPAIR Right    dog bite surgery      age 89 - plastic surgery on face    EVALUATION UNDER ANESTHESIA WITH HEMORRHOIDECTOMY N/A 02/25/2023   Procedure: EXAM UNDER ANESTHESIA WITH HEMORRHOIDECTOMY;  Surgeon: Andria Meuse, MD;  Location: [facility] OR;  Service: General;  Laterality: N/A;   EYE SURGERY     knife in eye  surgery- left eye    EYE SURGERY     right eye- due to dirt in eye    ORIF PATELLA Right 09/19/2013   Procedure: OPEN REDUCTION INTERNAL (ORIF) FIXATION RIGHT KNEE PATELLA;  Surgeon: Eugenia Mcalpine, MD;  Location: [facility] ORS;  Service: Orthopedics;  Laterality: Right;   PATELLA FRACTURE SURGERY Right     Family History  Problem Relation Age of Onset   Breast cancer Mother    Cancer Father    CAD Other     Social:  reports that he has quit smoking. His  smoking use included cigarettes. He has been exposed to tobacco smoke. His smokeless tobacco use includes snuff and chew. He reports that he does not currently use drugs after having used the following drugs: Marijuana. He reports that he does not drink alcohol.  Allergies: No Known Allergies  Medications: I have reviewed the patient's current medications.  No results found for this or any previous visit (from the past 48 hour(s)).  No results found.   PE Blood pressure (!) 123/94, pulse (!) 58, temperature 97.8 F (36.6 C), temperature source Oral, resp. rate 20, height 5\' 11"  (1.803 m), weight 94.3 kg, SpO2 98%. Constitutional: NAD; conversant Eyes: Moist conjunctiva; no lid lag; anicteric Lungs: Normal respiratory effort CV: RRR (From office visit, "Anorectal: Normal perianal skin. Small tags right anterior, right posterior. Normal overlying skin. No ulcerations. DRE-normal to increased tone. No palpable abnormalities." ) Psychiatric: Appropriate affect  No results found for this or any previous visit (from the past 48 hour(s)).  No results found.  A/P: Theodore Sanchez is an 47 y.o. male here for surgery for external hemorrhoidal tags  -Cscope with Dr. Barron Alvine 01/2023  Now s/p hemorrhoiectomy  Path benign hemorrhoids  -Small hemorrhoidal tags at excision sites have been somewhat problematic for him and will intermittently swell on the right side. Is also caused some minor discomfort but no pain. He would like these removed for these reasons.  -We discussed the anatomy as it pertains to his current postsurgical anatomy with regards to hemorrhoidectomy. We discussed the pathophysiology of hemorrhoidal tags.  -We have reviewed options going forward including further observation vs surgery -excision of perianal skin tags; anorectal exam under anesthesia -The planned procedure, material risks (including, but not limited to, pain, bleeding, infection, scarring, need for blood  transfusion, damage to anal sphincter, incontinence of gas and/or stool, need for additional procedures, anal stenosis, rare cases of pelvic sepsis which in severe cases may require things like a colostomy, recurrence, pneumonia, heart attack, stroke, death) benefits and alternatives to surgery were discussed at length. The patient's questions were answered to his satisfaction, he voiced understanding and elected to proceed with surgery. Additionally, we discussed typical postoperative expectations and the recovery process. We also discussed wound expectations following excision including the fact that there will always be some heaped up type tissue here and it may never be completely flat.  Marin Olp, MD [facility]

## 2023-07-21 NOTE — Op Note (Signed)
07/21/2023  11:08 AM  PATIENT:  Theodore Sanchez  47 y.o. male  Patient Care Team: [name], MD as PCP - General (Internal Medicine) Shellia Cleverly, DO as Consulting Physician (Gastroenterology)  PRE-OPERATIVE DIAGNOSIS:  External hemorrhoidal tags  POST-OPERATIVE DIAGNOSIS:  Same  PROCEDURE:   Excision of external hemorrhoidal tags - right anterior and posterior Anorectal exam under anesthesia  SURGEON:  Stephanie Coup. Nare Gaspari, MD  ANESTHESIA:   local and general  COUNTS:  Sponge, needle and instrument counts were reported correct x2 at the conclusion of the operation.  EBL: 5 mL  DRAINS: None  SPECIMEN: Right external hemorrhoidal tags  COMPLICATIONS: none  FINDINGS: External hemorrhoidal tags right anterior and posterior excised  DISPOSITION: PACU in satisfactory condition  DESCRIPTION: The patient was identified in preop holding and taken to the OR where he was placed on the operating room table. SCDs were placed. General anesthesia was induced without difficulty. He was positioned in lithotomy with Allen stirrups. Pressure points were evaluated and padded. He was then prepped and draped in the usual sterile fashion. A surgical timeout was performed indicating the correct patient, procedure, positioning and need for preoperative antibiotics.   In the right anterior and posterior positions the known external hemorrhoidal tags are identified.  These are essentially redundant skin at this location.  Perianal skin otherwise normal.  DRE-normal tone/squeeze, no palpable abnormality.  Circumferential anoscopy demonstrates healthy appearing anoderm.  Minimal hemorrhoidal component on the left side.  Tags which were bothersome were identified on the right.  The tags are fairly confluent in the anterior posterior position and did not appear to be amenable to individual radial type excisions.  They are also external enough that excising these is 1 unit should not increase  his risk for anal stenosis.  They are elevated with a forcep.  The hemorrhoidal tags are excised sharply after dissecting free the underlying external sphincter muscle.  These were submitted as specimen.  The area was irrigated.  Hemostasis is achieved electrocautery.  No sphincter muscle was divided.  We then approximated the anoderm at this location using 3-0 interrupted chromic sutures.  The closure is without gaps.  The area was irrigated.  There is no ectropion created by this closure as we limited it to just the redundant tissue.  All sponge, needle, instrument counts are reported correct.  Additional local anesthetic was infiltrated at the excision site.  A dressing consisting of 4 x 4's, ABD, and mesh underwear was ultimately placed.  He was taken out of lithotomy position, awakened from anesthesia, extubated, and transferred to a stretcher for transport to recovery in satisfactory condition.

## 2023-07-21 NOTE — Discharge Instructions (Addendum)
ANORECTAL SURGERY: POST OP INSTRUCTIONS  DIET: Follow a light bland diet the first 24 hours after arrival home, such as soup, liquids, crackers, etc.  Be sure to include lots of fluids daily.  Avoid fast food or heavy meals as your are more likely to get nauseated.  Eat a low fat diet the next few days after surgery.   Some bleeding with bowel movements is expected for the first couple of days but this should stop in between bowel movements  Take your usually prescribed home medications unless otherwise directed. No foreign bodies per rectum for the next 3 months (enemas, etc)  PAIN CONTROL: It is helpful to take an over-the-counter pain medication regularly for the first few days/weeks.  Choose from the following that works best for you: Ibuprofen (Advil, etc) Three 200mg tabs every 6 hours as needed. Acetaminophen (Tylenol, etc) 500-650mg every 6 hours as needed NOTE: You may take both of these medications together - most patients find it most helpful when alternating between the two (i.e. Ibuprofen at 6am, tylenol at 9am, ibuprofen at 12pm ...) A  prescription for pain medication may have been prescribed for you at discharge.  Take your pain medication as prescribed.  If you are having problems/concerns with the prescription medicine, please call us for further advice.  Avoid getting constipated.  Between the surgery and the pain medications, it is common to experience some constipation.  Increasing fluid intake (64oz of water per day) and taking a fiber supplement (such as Metamucil, Citrucel, FiberCon) 1-2 times a day regularly will usually help prevent this problem from occurring.  Take Miralax (over the counter) 1-2x/day while taking a narcotic pain medication. If no bowel movement after 48hours, you may additionally take a laxative like a bottle of Milk of Magnesia which can be purchased over the counter. Avoid enemas.   Watch out for diarrhea.  If you have many loose bowel movements,  simplify your diet to bland foods.  Stop any stool softeners and decrease your fiber supplement. If this worsens or does not improve, please call us.  Wash / shower every day.  If you were discharged with a dressing, you may remove this the day after your surgery. You may shower normally, getting soap/water on your wound, particularly after bowel movements.  Soaking in a warm bath filled a couple inches ("Sitz bath") is a great way to clean the area after a bowel movement and many patients find it is a way to soothe the area.  ACTIVITIES as tolerated:   You may resume regular (light) daily activities beginning the next day--such as daily self-care, walking, climbing stairs--gradually increasing activities as tolerated.  If you can walk 30 minutes without difficulty, it is safe to try more intense activity such as jogging, treadmill, bicycling, low-impact aerobics, etc. Refrain from any heavy lifting or straining for the first 2 weeks after your procedure, particularly if your surgery was for hemorrhoids. Avoid activities that make your pain worse You may drive when you are no longer taking prescription pain medication, you can comfortably wear a seatbelt, and you can safely maneuver your car and apply brakes.  FOLLOW UP in our office Please call [facility] at [phone] to set up an appointment to see your surgeon in the office for a follow-up appointment approximately 2 weeks after your surgery. Make sure that you call for this appointment the day you arrive home to insure a convenient appointment time.  9. If you have disability or family leave forms   that need to be completed, you may have them completed by your primary care physician's office; for return to work instructions, please ask our office staff and they will be happy to assist you in obtaining this documentation   When to call us [phone]: Poor pain control Reactions / problems with new medications (rash/itching, etc)  Fever over  101.5 F (38.5 C) Inability to urinate Nausea/vomiting Worsening swelling or bruising Continued bleeding from incision. Increased pain, redness, or drainage from the incision  The clinic staff is available to answer your questions during regular business hours (8:30am-5pm).  Please don't hesitate to call and ask to speak to one of our nurses for clinical concerns.   A surgeon from [facility] is always on call at the hospitals   If you have a medical emergency, go to the nearest emergency room or call 911.   [facility] [address], [address], [place], [place]  [address] MAIN: [phone] FAX: [phone] [email or web address] 

## 2023-07-21 NOTE — Anesthesia Postprocedure Evaluation (Signed)
Anesthesia Post Note  Patient: Theodore Sanchez  Procedure(s) Performed: EXCISION OF PERIANAL  SKIN TAGS X2 (Right: Rectum)     Patient location during evaluation: PACU Anesthesia Type: General Level of consciousness: awake and alert Pain management: pain level controlled Vital Signs Assessment: post-procedure vital signs reviewed and stable Respiratory status: spontaneous breathing, nonlabored ventilation and respiratory function stable Cardiovascular status: stable and blood pressure returned to baseline Anesthetic complications: no   No notable events documented.  Last Vitals:  Vitals:   07/21/23 1100 07/21/23 1106  BP: 107/77   Pulse: (!) 49 (!) 47  Resp: 17 17  Temp:  36.6 C  SpO2: 97% 95%    Last Pain:  Vitals:   07/21/23 1100  TempSrc:   PainSc: 0-No pain                 Beryle Lathe

## 2023-07-21 NOTE — Anesthesia Procedure Notes (Signed)
Procedure Name: LMA Insertion Date/Time: 07/21/2023 9:56 AM  Performed by: Mayer Camel, CRNAPre-anesthesia Checklist: Patient identified, Emergency Drugs available, Suction available and Patient being monitored Patient Re-evaluated:Patient Re-evaluated prior to induction Oxygen Delivery Method: Circle System Utilized Preoxygenation: Pre-oxygenation with 100% oxygen Induction Type: IV induction Ventilation: Mask ventilation without difficulty LMA: LMA inserted LMA Size: 5.0 Number of attempts: 1 Airway Equipment and Method: Bite block Placement Confirmation: positive ETCO2 Tube secured with: Tape Dental Injury: Teeth and Oropharynx as per pre-operative assessment

## 2023-07-22 ENCOUNTER — Encounter (HOSPITAL_COMMUNITY): Payer: Self-pay | Admitting: Surgery

## 2023-07-22 LAB — SURGICAL PATHOLOGY

## 2023-09-07 ENCOUNTER — Ambulatory Visit (INDEPENDENT_AMBULATORY_CARE_PROVIDER_SITE_OTHER): Payer: Commercial Managed Care - HMO | Admitting: Family

## 2023-09-07 ENCOUNTER — Encounter: Payer: Self-pay | Admitting: Family

## 2023-09-07 VITALS — BP 122/76 | HR 57 | Temp 98.0°F | Ht 71.0 in | Wt 206.0 lb

## 2023-09-07 DIAGNOSIS — H6012 Cellulitis of left external ear: Secondary | ICD-10-CM | POA: Diagnosis not present

## 2023-09-07 DIAGNOSIS — K648 Other hemorrhoids: Secondary | ICD-10-CM | POA: Diagnosis not present

## 2023-09-07 NOTE — Progress Notes (Signed)
 Patient ID: Theodore Sanchez, male    DOB: Aug 01, 1976, 47 y.o.   MRN: [id number]  Chief Complaint  Patient presents with   Hemorrhoids    Pt c/o hemorrhoids, seen on 10/09/2022.    Ear Pain    Pt c/o left ear pain, present for a couple of weeks. Has tried hyrdrogen peroxide.        Discussed the use of AI scribe software for clinical note transcription with the patient, who gave verbal consent to proceed.  History of Present Illness   The patient, with a history of hemorrhoids, presents with another episode. He first experienced hemorrhoids around the age of 43, which was a significant and uncomfortable event for him. Over the past year, he has had multiple treatments including banding and surgical interventions. The patient reports that the current hemorrhoid is internal and does not cause any itching or bleeding. He has tried suppositories and creams in the past, but found them ineffective. The patient is seeking banding treatment, which he found to be the most effective in the past.  In addition to the hemorrhoid issue, the patient also presents with a concern about his ear. He noticed a scab on the outer ear about two weeks ago, which he suspects might be infected. The patient has been cleaning the area with hydrogen peroxide. He reports a small amount of bleeding from the scab but no pain in the ear.     Assessment & Plan:     Hemorrhoids - Internal hemorrhoid causing discomfort but no bleeding or pain. Previous history of hemorrhoid banding and surgery. Patient prefers banding over other treatments. -Attempt to schedule with [facility] or [facility] offices for banding procedure. Pt given both phone numbers to contact since seen in the past year.  Ear Dermatitis - Mild swelling w/pinkish coloring and scaling noted on the left outer ear, lower pinna, unsure if contact w/an irritant or bite. Mild tenderness with palpation, no warmth noted. -Clean with soap and water,  dry thoroughly, and apply a thin layer of Neosporin daily. -Consider over-the-counter hydrocortisone  cream for itching and swelling if needed. -Follow-up if swelling, pain, or redness increases or condition worsens.    Subjective:    No outpatient medications prior to visit.   No facility-administered medications prior to visit.   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   Past Surgical History:  Procedure Laterality Date   ANTERIOR CRUCIATE LIGAMENT REPAIR     2009    ANTERIOR CRUCIATE LIGAMENT REPAIR Right    dog bite surgery      age 21 - plastic surgery on face    EVALUATION UNDER ANESTHESIA WITH HEMORRHOIDECTOMY N/A 02/25/2023   Procedure: EXAM UNDER ANESTHESIA WITH HEMORRHOIDECTOMY;  Surgeon: Teresa Lonni HERO, MD;  Location: [facility] OR;  Service: General;  Laterality: N/A;   EXCISION OF SKIN TAG Right 07/21/2023   Procedure: EXCISION OF PERIANAL  SKIN TAGS X2;  Surgeon: Teresa Lonni HERO, MD;  Location: [facility] OR;  Service: General;  Laterality: Right;   EYE SURGERY     knife in eye surgery- left eye    EYE SURGERY     right eye- due to dirt in eye    ORIF PATELLA Right 09/19/2013   Procedure: OPEN REDUCTION INTERNAL (ORIF) FIXATION RIGHT KNEE PATELLA;  Surgeon: Lamar Collet, MD;  Location: [facility];  Service: Orthopedics;  Laterality: Right;   PATELLA  FRACTURE SURGERY Right    No Known Allergies    Objective:    Physical Exam Vitals and nursing note reviewed.  Constitutional:      General: He is not in acute distress.    Appearance: Normal appearance.  HENT:     Head: Normocephalic.  Cardiovascular:     Rate and Rhythm: Normal rate and regular rhythm.  Pulmonary:     Effort: Pulmonary effort is normal.     Breath sounds: Normal breath sounds.  Musculoskeletal:        General: Normal range of motion.     Cervical back: Normal range of motion.  Skin:    General: Skin is warm and dry.      Findings: Rash present. Rash is scaling (left lower (tragus) of pinna, w/mild swelling, pinkish color).  Neurological:     Mental Status: He is alert and oriented to person, place, and time.  Psychiatric:        Mood and Affect: Mood normal.    Temp 98 F (36.7 C) (Temporal)   Wt 206 lb (93.4 kg)   BMI 28.73 kg/m  Wt Readings from Last 3 Encounters:  09/07/23 206 lb (93.4 kg)  07/21/23 208 lb (94.3 kg)  02/25/23 206 lb (93.4 kg)       Lucius Krabbe, NP

## 2023-09-10 ENCOUNTER — Telehealth: Payer: Self-pay | Admitting: Gastroenterology

## 2023-09-10 NOTE — Telephone Encounter (Signed)
 Patient calls requesting to schedule a 3rd hemorrhoidal banding with Dr San. States he was told if he ever wanted the 3rd hemorrhoid banded or had any problems, he could call us  back. He denies any bleeding or rectal pain. States I can feel it but this is nothing compared to all I have gone through this past year. Patient had hemorrhoidectomy with Dr Teresa and subsequent perianal skin tag removal 07/21/23.   Dr C- Is it ok to place patient on for internal hemorrhoid banding following recent rectal surgeries?

## 2023-09-10 NOTE — Telephone Encounter (Signed)
 Patient requesting to speak with a nurse to discuss Hemorid banding. Please advise.   Thank you

## 2023-09-10 NOTE — Telephone Encounter (Signed)
 Patient is advised of Dr Frankey Shown response/recommendations and is aware a note has been sent to Dr Cliffton Asters for his recommendation as well.

## 2023-09-29 NOTE — Progress Notes (Signed)
 PROVIDER:  LONNI OZELL PIZZA, MD  MRN: [id number] DOB: 11-09-1975 DATE OF ENCOUNTER: 09/29/2023  Subjective   Chief Complaint: Here today for possible hemorrhoids   History of Present Illness: Theodore Sanchez is a 48 y.o. male with no reported history, whom is seen in the office today as a referral by Dr. Arch for evaluation of possible hemorrhoids.  Reports a history dating back over the last 8 to 10 years of occasional tissue prolapse and discomfort.  3 prior attacks where he had the swelling and pain in the perianal area that was presumed to be related to a thrombosed hemorrhoid.  The first one happened in his mid 30s and was quite uncomfortable for the period of a few weeks to months.  Gradually improved.  Currently, denies any active anal pain.  He is aware of the sensation that something is slightly uncomfortable particularly if he strains or works out.  He takes a stool softener every evening-described to be Colace.  He drinks less than 64 ounces of water a day currently.  Spends less than 3 minutes on the commode.  Reports he has a soft bowel movement daily. Occasional straining.  He denies any prior anorectal surgeries or procedures  Colonoscopy with Dr. San 01/29/2023: - 8 mm polyp in the ascending colon, removed - Two 2 - 4 mm polyps in the sigmoid and splenic flexure removed - Nonbleeding internal hemorrhoids - Examined portion of ileum normal  PATH - Tubular adenomas, no dysplasia  He has been reliably taking fiber and keeping his stools nice and soft.  Staying hydrated.  Minimizing commode time to 2 to 3 minutes.  Despite that, he has had ongoing issues with occasional tissue prolapse, pain, and a recent thrombosis.  He has been following with GI and has been banded once.  He does not feel that he is really improved and again had a thrombosis following all this on an external component and comes back to see us  today to discuss surgery.  OR 02/25/23 -  hemorrhoidectomy, EUA PATH A. HEMORRHOIDS, RIGHT ANTERIOR, EXCISION:  -  Dilated submucosa vessels consistent with hemorrhoids   Returns for follow-up.  He has been doing well.  He has been working to keep his stool soft.  He denies any constipation or straining.  He is taking a fiber supplement.  He has had some trouble with regards to some perianal skin tags primarily trouble with hygiene/occasional swelling particular after lifting right at the external opening of the anus and he is isolated this to be on the right side.  He denies any significant pain or bleeding.  He denies any pain with bowel movements.  He denies any difficulty evacuating stool.  OR 07/21/23-excision of external hemorrhoidal tags right anterior and posterior; anorectal exam under anesthesia.  PATH FINAL MICROSCOPIC DIAGNOSIS:   A. HEMORRHOIDS, RIGHT EXTERNAL, EXCISION:  - Benign hemorrhoidal tissue   Seen for follow-up 08/2023 and noted to be doing well. Had noted he wasn't drinking enough water and still occasionally getting constipated.  INTERVAL HX  History of Present Illness Theodore Sanchez, a 48 year old with a history of hemorrhoidal issues, presents for follow-up after undergoing two hemorrhoidectomies in June 2024 and a subsequent hemorrhoidal procedure in November 2024. He was last seen in December 2024, approximately one month ago, and was noted to be doing well, albeit with occasional constipation and inadequate water intake.  The patient reports a recent episode of possible tissue prolapse, which was brief and has not recurred. This  was associated with a violent bowel movement following a meal of steak. The patient describes the prolapsed tissue as soft and transient, resolving by the next morning. However, he reports a persistent sensation of something being present in the anal region, particularly when sitting down. This sensation has been less noticeable in the past two days.  The patient also reports a  change in bowel habits, with bowel movements no longer being as clean as he used to be. He describes a feeling of incomplete evacuation and messier stools. Despite these changes, the patient denies any pain or discomfort during bowel movements.  The patient has been maintaining a regimen of five fiber pills and one stool softener daily. He reports regular bowel movements prior to the recent changes, typically occurring after gym sessions. The patient continues to engage in regular physical activity, including weight lifting and boxing, without any significant discomfort.  The patient expresses concern about the persistence of these symptoms and questions whether he will continue to experience such issues in the future. He denies any blood in his stool.    PMH: Denies  FHx: Mother had breast cancer; otherwise denies any known family history of colorectal, breast, endometrial or ovarian cancer  Social Hx: Denies use of tobacco/EtOH/illicit drug.  He is self-employed-works as a Catering manager History: Past Medical History:  Diagnosis Date  . Arthritis     There is no problem list on file for this patient.   Past Surgical History:  Procedure Laterality Date  . HEMORRHOIDECTOMY EXTERNAL  02/25/2023   Dr Teresa     No Known Allergies  Current Outpatient Medications on File Prior to Visit  Medication Sig Dispense Refill  . traMADoL  (ULTRAM ) 50 mg tablet Take 1 tablet (50 mg total) by mouth every 6 (six) hours as needed for Pain (Patient not taking: Reported on 09/29/2023) 15 tablet 0   No current facility-administered medications on file prior to visit.    History reviewed. No pertinent family history.   Social History   Tobacco Use  Smoking Status Former  . Types: Cigarettes  Smokeless Tobacco Never     Social History   Socioeconomic History  . Marital status: Single  Tobacco Use  . Smoking status: Former    Types: Cigarettes  . Smokeless tobacco: Never  Vaping  Use  . Vaping status: Unknown  Substance and Sexual Activity  . Alcohol use: Not Currently  . Drug use: Never   Social Drivers of Health   Housing Stability: Unknown (09/29/2023)   Housing Stability Vital Sign   . Homeless in the Last Year: No    Objective:    There were no vitals filed for this visit.    There is no height or weight on file to calculate BMI.  Constitutional: NAD; conversant Eyes: Moist conjunctiva; anicteric Lungs: Normal respiratory effort CV: RRR Anorectal: Normal perianal skin.  No external tags or hemorrhoids.  DRE-normal tone/squeeze.  No palpable abnormalities. Anoscopy: Circumferential anoscopy demonstrates healthy appearing anoderm.  No significant internal hemorrhoidal components.  No fissures.  No granulation tissue.   Psychiatric: Appropriate affect  Procedure: Anoscopy We spent time reviewing the procedure, material risks (including but not limited to perianal pain, bleeding) benefits and alternatives.  Witnessed, informed consent was obtained.  Narrative:  The lubricated anoscope was gently inserted into the anal canal. Circumferential anoscopy demonstrates findings noted above  A chaperone, Coleta Molt, CMA, was present for this encounter and examination  Assessment and Plan:  Diagnoses and all  orders for this visit:  S/P hemorrhoidectomy     Theodore Sanchez is a very pleasant 48 y.o. male here for evaluation of anal pain related to what he describes to be an event consistent with prior external hemorrhoidal thromboses.  -Cscope with Dr. San 01/2023  Now s/p hemorrhoiectomy  Path benign hemorrhoids  OR 07/21/23 - hemorrhoidectomy PATH - benign hemorrhoidal tissue  Assessment & Plan Post Hemorrhoidectomy He reports overall improvement after each respective procedure.  All of the symptoms he experienced prior to his skin tag related to his skin tag/external hemorrhoid have resolved as well.  Patient reports intermittent  sensation of tissue prolapse and burning sensation. No visible hemorrhoids or skin tags on examination. Patient has a history of constipation and inadequate hydration. -Continue high fiber diet, fiber pills and stool softeners and increase water intake. -Consider pelvic floor physical therapy if symptoms persist -certainly some of the incomplete evacuation type sensations could be related to that.  If in the next 3 months his symptoms were to persist, discussed that we will likely proceed with pelvic floor physical therapy/biofeedback prior to embarking on any other surgical type procedures.  Follow-up in 3 months or sooner if symptoms worsen.   Return in about 3 months (around 12/28/2023).  I spent a total of 30 minutes in both face-to-face and non-face-to-face activities, excluding procedures performed, for this visit on the date of this encounter.   Lonni Pizza, MD [facility]

## 2024-03-16 NOTE — Progress Notes (Signed)
 PROVIDER:  LONNI OZELL PIZZA, MD  MRN: [id number] DOB: 07/09/1976 DATE OF ENCOUNTER: 03/16/2024  Subjective   Chief Complaint: Long term follow-up   History of Present Illness: Theodore Sanchez is a 48 y.o. male with no reported history, whom is seen in the office today as a referral by Theodore Sanchez for evaluation of possible hemorrhoids.  Reports a history dating back over the last 8 to 10 years of occasional tissue prolapse and discomfort.  3 prior attacks where he had the swelling and pain in the perianal area that was presumed to be related to a thrombosed hemorrhoid.  The first one happened in his mid 30s and was quite uncomfortable for the period of a few weeks to months.  Gradually improved.  Currently, denies any active anal pain.  He is aware of the sensation that something is slightly uncomfortable particularly if he strains or works out.  He takes a stool softener every evening-described to be Colace.  He drinks less than 64 ounces of water a day currently.  Spends less than 3 minutes on the commode.  Reports he has a soft bowel movement daily. Occasional straining.  He denies any prior anorectal surgeries or procedures  Colonoscopy with Theodore Sanchez 01/29/2023: - 8 mm polyp in the ascending colon, removed - Two 2 - 4 mm polyps in the sigmoid and splenic flexure removed - Nonbleeding internal hemorrhoids - Examined portion of ileum normal  PATH - Tubular adenomas, no dysplasia  He has been reliably taking fiber and keeping his stools nice and soft.  Staying hydrated.  Minimizing commode time to 2 to 3 minutes.  Despite that, he has had ongoing issues with occasional tissue prolapse, pain, and a recent thrombosis.  He has been following with GI and has been banded once.  He does not feel that he is really improved and again had a thrombosis following all this on an external component and comes back to see us  today to discuss surgery.  OR 02/25/23 - hemorrhoidectomy,  EUA PATH A. HEMORRHOIDS, RIGHT ANTERIOR, EXCISION:  -  Dilated submucosa vessels consistent with hemorrhoids   Returns for follow-up.  He has been doing well.  He has been working to keep his stool soft.  He denies any constipation or straining.  He is taking a fiber supplement.  He has had some trouble with regards to some perianal skin tags primarily trouble with hygiene/occasional swelling particular after lifting right at the external opening of the anus and he is isolated this to be on the right side.  He denies any significant pain or bleeding.  He denies any pain with bowel movements.  He denies any difficulty evacuating stool.  OR 07/21/23-excision of external hemorrhoidal tags right anterior and posterior; anorectal exam under anesthesia.  PATH FINAL MICROSCOPIC DIAGNOSIS:   A. HEMORRHOIDS, RIGHT EXTERNAL, EXCISION:  - Benign hemorrhoidal tissue   Seen for follow-up 08/2023 and noted to be doing well. Had noted he wasn't drinking enough water and still occasionally getting constipated.   History of Present Illness Theodore Sanchez, a 48 year old with a history of hemorrhoidal issues, presents for follow-up after undergoing two hemorrhoidectomies in June 2024 and a subsequent hemorrhoidal procedure in November 2024. He was last seen in December 2024, approximately one month ago, and was noted to be doing well, albeit with occasional constipation and inadequate water intake.  The patient reports a recent episode of possible tissue prolapse, which was brief and has not recurred. This was associated with a  violent bowel movement following a meal of steak. The patient describes the prolapsed tissue as soft and transient, resolving by the next morning. However, he reports a persistent sensation of something being present in the anal region, particularly when sitting down. This sensation has been less noticeable in the past two days.  The patient also reports a change in bowel habits, with bowel  movements no longer being as clean as he used to be. He describes a feeling of incomplete evacuation and messier stools. Despite these changes, the patient denies any pain or discomfort during bowel movements.  The patient has been maintaining a regimen of five fiber pills and one stool softener daily. He reports regular bowel movements prior to the recent changes, typically occurring after gym sessions. The patient continues to engage in regular physical activity, including weight lifting and boxing, without any significant discomfort.  The patient expresses concern about the persistence of these symptoms and questions whether he will continue to experience such issues in the future. He denies any blood in his stool.  INTERVAL HX  Returns today for follow-up.  Had missed the last few appointments with us .  He reports overall doing reasonly well.  He does not have any active hemorrhoidal issues.  He still notes some unusual sensation of potentially foreign body type sensation in the perianal area at random times throughout the day.  No pain with defecation.  No bleeding.  He has been reliably sticking to a fiber regimen taking 8 fiber tablets and a stool softener every night.  With this, he has been able to avoid any constipation.   PMH: Denies  FHx: Theodore Sanchez had breast cancer; otherwise denies any known family history of colorectal, breast, endometrial or ovarian cancer  Social Hx: Denies use of tobacco/EtOH/illicit drug.  He is self-employed-works as a Catering manager History: Past Medical History:  Diagnosis Date  . Arthritis     There is no problem list on file for this patient.   Past Surgical History:  Procedure Laterality Date  . HEMORRHOIDECTOMY EXTERNAL  02/25/2023   Dr Theodore Sanchez     No Known Allergies  Current Outpatient Medications on File Prior to Visit  Medication Sig Dispense Refill  . traMADoL  (ULTRAM ) 50 mg tablet Take 1 tablet (50 mg total) by mouth every 6 (six)  hours as needed for Pain (Patient not taking: Reported on 03/16/2024) 15 tablet 0   No current facility-administered medications on file prior to visit.    No family history on file.   Social History   Tobacco Use  Smoking Status Former  . Types: Cigarettes  Smokeless Tobacco Never     Social History   Socioeconomic History  . Marital status: Single  Tobacco Use  . Smoking status: Former    Types: Cigarettes  . Smokeless tobacco: Never  Vaping Use  . Vaping status: Unknown  Substance and Sexual Activity  . Alcohol use: Not Currently  . Drug use: Never   Social Drivers of Health   Housing Stability: Unknown (09/29/2023)   Housing Stability Vital Sign   . Homeless in the Last Year: No    Objective:    Vitals:   03/16/24 1609  PainSc: 0-No pain      There is no height or weight on file to calculate BMI.  Constitutional: NAD; conversant Eyes: Moist conjunctiva; anicteric Lungs: Normal respiratory effort CV: RRR Anorectal: Normal perianal skin.  No external tags or hemorrhoids.  DRE-normal tone/squeeze.  No palpable abnormalities. Anoscopy:  Circumferential anoscopy demonstrates healthy appearing anoderm.  No significant internal hemorrhoidal components.  No fissures.  No granulation tissue.   Psychiatric: Appropriate affect  Procedure: Anoscopy We spent time reviewing the procedure, material risks (including but not limited to perianal pain, bleeding) benefits and alternatives.  Witnessed, informed consent was obtained.  Narrative:  The lubricated anoscope was gently inserted into the anal canal. Circumferential anoscopy demonstrates findings noted above  A chaperone, Coleta Molt, CMA, was present for this encounter and examination  Assessment and Plan:  Diagnoses and all orders for this visit:  Pelvic floor dysfunction      Theodore Sanchez is a very pleasant 48 y.o. male here for evaluation of anal pain related to what he describes to be an event  consistent with prior external hemorrhoidal thromboses.  -Cscope with Theodore Sanchez 01/2023  Now s/p hemorrhoiectomy  Path benign hemorrhoids  OR 07/21/23 - hemorrhoidectomy PATH - benign hemorrhoidal tissue  - Continue high fiber diet, fiber pills and stool softeners and increase water intake. - Referral to pelvic floor physical therapy - certainly some of the incomplete evacuation type sensations could be related to that as well as his occasional foreign body sensations, suspect levator spasm.  - Long-term GI follow-up with Theodore Sanchez (GI, following him endoscopically)  Return in about 6 months (around 09/16/2024).  I spent a total of 40 minutes in both face-to-face and non-face-to-face activities, excluding procedures performed, for this visit on the date of this encounter.   Theodore Pizza, MD [facility]

## 2024-03-28 NOTE — Therapy (Signed)
 OUTPATIENT PHYSICAL THERAPY MALE PELVIC EVALUATION   Patient Name: Theodore Sanchez MRN: [id number] DOB:12-Dec-1975, 48 y.o., male Today's Date: 03/29/2024  END OF SESSION:  PT End of Session - 03/29/24 0810     Visit Number 1    Date for PT Re-Evaluation 09/13/24    Authorization Type [employer]    PT Start Time 0807    PT Stop Time 0843    PT Time Calculation (min) 36 min    Activity Tolerance Patient tolerated treatment well    Behavior During Therapy [facility] for tasks assessed/performed          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   Past Surgical History:  Procedure Laterality Date   ANTERIOR CRUCIATE LIGAMENT REPAIR     2009    ANTERIOR CRUCIATE LIGAMENT REPAIR Right    dog bite surgery      age 28 - plastic surgery on face    EVALUATION UNDER ANESTHESIA WITH HEMORRHOIDECTOMY N/A 02/25/2023   Procedure: EXAM UNDER ANESTHESIA WITH HEMORRHOIDECTOMY;  Surgeon: Teresa Lonni HERO, MD;  Location: [facility] OR;  Service: General;  Laterality: N/A;   EXCISION OF SKIN TAG Right 07/21/2023   Procedure: EXCISION OF PERIANAL  SKIN TAGS X2;  Surgeon: Teresa Lonni HERO, MD;  Location: [facility] OR;  Service: General;  Laterality: Right;   EYE SURGERY     knife in eye surgery- left eye    EYE SURGERY     right eye- due to dirt in eye    ORIF PATELLA Right 09/19/2013   Procedure: OPEN REDUCTION INTERNAL (ORIF) FIXATION RIGHT KNEE PATELLA;  Surgeon: Lamar Collet, MD;  Location: [facility];  Service: Orthopedics;  Laterality: Right;   PATELLA FRACTURE SURGERY Right    Patient Active Problem List   Diagnosis Date Noted   History of colon polyps 02/07/2023   Tourette's syndrome 10/09/2022   Cannabis use with anxiety disorder (HCC) 10/09/2022   Thrombosed hemorrhoids 12/31/2020   FH: hypothyroidism 12/13/2012   Alopecia    Anxiety     PCP: Jesus Bernardino MATSU, MD  REFERRING PROVIDER: Teresa Lonni HERO,  MD   REFERRING DIAG: [phone] (ICD-10-CM) - Other specified disorders of muscle  THERAPY DIAG:  Other muscle spasm  Unspecified lack of coordination  Abnormal posture  Muscle weakness (generalized)  Rationale for Evaluation and Treatment: Rehabilitation  ONSET DATE: 03/29/2018  SUBJECTIVE:  SUBJECTIVE STATEMENT: Pt states that he has long history of hemorrhoids. He has had two excisions of hemorrhoids. He feels like he is stable without pain, but he never feels like he completely empties. The first thing he does every morning is goes to the gym and then he'll come home and have a bowel movement. He drinks a lot of water at the gym. He makes sure he does not stay on the toilet for long periods of time.   PAIN:  Are you having pain? No   PRECAUTIONS: None  RED FLAGS: None   WEIGHT BEARING RESTRICTIONS: No  FALLS:  Has patient fallen in last 6 months? No  OCCUPATION: bail bondsman  ACTIVITY LEVEL : weight lifting and boxing on regular basis  PLOF: Independent  PATIENT GOALS: to empty more completely with bowel movements   PERTINENT HISTORY:  Hemorrhoidectomy 6/24, removal of hemorrhoids/skin tags 11/24, anxiety,    BOWEL MOVEMENT: Pain with bowel movement: No Type of bowel movement:Type (Bristol Stool Scale) 4, Frequency 1x/day, and Strain no straining Fully empty rectum: No Leakage: No Pads: No Fiber supplement/laxative Yes fiber and stool softener He does feel a lot of sensitivity surrounding anus that started after second surgery  URINATION: Pain with urination: No Fully empty bladder: Yes: - Stream: Strong Urgency: No Frequency: WNL Fluid Intake: a lot of water Leakage: none Pads: No  INTERCOURSE:  WNL   OBJECTIVE:  Note: Objective measures were completed at  Evaluation unless otherwise noted.  03/29/24: PATIENT SURVEYS:   PFIQ-7: 13  COGNITION: Overall cognitive status: Within functional limits for tasks assessed     SENSATION: Light touch: Appears intact GAIT: Assistive device utilized: None Comments: Forward flexed posture  POSTURE: rounded shoulders, forward head, decreased lumbar lordosis, increased thoracic kyphosis, posterior pelvic tilt, and gluteal clenching   PALPATION:   General: gluteal clenching/tightness  Abdominal: tightness                External Perineal Exam: increased sensitivity (Lt>Rt)                             Internal Pelvic Floor: scar tissue restriction Lt side; significant discomfort throughout exam  Patient confirms identification and approves PT to assess internal pelvic floor and treatment Yes  PELVIC MMT:   MMT eval  Internal Anal Sphincter 0/5  External Anal Sphincter 0/5  Puborectalis 0/5  (Blank rows = not tested)        TONE: high   TODAY'S TREATMENT:                                                                                                                              DATE:  03/29/24 EVAL  Neuromuscular re-education: Down training program given Cat cow Butterfly Child's pose Happy baby Diaphragmatic breathing for improved pelvic floor muscle relaxation/lengthening Therapeutic activities: Gentle cleansing techniques when he cannot  get directly into the shower  Bowel mobilization Squatty potty/relaxation and appropriate posture on toilet Exhale with pushing - blowing up a balloon or through a straw during bowel movements     PATIENT EDUCATION:  Education details: See above Person educated: Patient Education method: Explanation, Demonstration, Tactile cues, Verbal cues, and Handouts Education comprehension: verbalized understanding  HOME EXERCISE PROGRAM: [id number]  ASSESSMENT:  CLINICAL IMPRESSION: Patient is a 48 y.o. male who was seen today for physical  therapy evaluation and treatment for incomplete bowel emptying. Exam findings notable for abnormal posture, gluteal clenching, increased pelvic floor muscle tone, scar tissue Lt external anal sphincter, discomfort with rectal exam, inability to perform active pelvic floor muscle contraction, inability to voluntarily length external anal sphincter and bulge pelvic floor muscles with internal feedback and verbal cues. Signs and symptoms are most consistent with poor coordination of pelvic floor muscles and external anal sphincter and high tone pelvic floor muscles with gluteal/abdominal clenching. Initial treatment consisted of down training, diaphragmatic breathing, gentle perineal cleansing techniques, squatty potty, relaxed toilet mechanics/posture, and exhale with pushing. He will continue to benefit from skilled PT intervention in order to improve complete evacuation with bowel movements, increase external anal sphincter and pelvic floor muscle coordination, address all impairments, and improve quality of life.   OBJECTIVE IMPAIRMENTS: decreased activity tolerance, decreased coordination, decreased endurance, decreased mobility, decreased ROM, decreased strength, increased fascial restrictions, increased muscle spasms, impaired flexibility, impaired tone, improper body mechanics, postural dysfunction, and pain.   ACTIVITY LIMITATIONS: bowel movements   PARTICIPATION LIMITATIONS: community activity, occupation, and toileting  PERSONAL FACTORS: 1 comorbidity: medical history are also affecting patient's functional outcome.   REHAB POTENTIAL: Good  CLINICAL DECISION MAKING: Stable/uncomplicated  EVALUATION COMPLEXITY: Low   GOALS: Goals reviewed with patient? Yes  SHORT TERM GOALS: Target date: 04/26/2024   Pt will be independent with HEP.   Baseline: Goal status: INITIAL  2.  Pt will be independent with gentle cleansing techniques after bowel movements in order to decrease sensitivity and  discomfort at anus.  Baseline:  Goal status: INITIAL  3.  Pt will be independent with use of squatty potty, relaxed toileting mechanics, and improved bowel movement techniques in order to increase ease of bowel movements and complete evacuation.   Baseline:  Goal status: INITIAL  4.  Pt will be independent with diaphragmatic breathing and down training activities in order to improve pelvic floor relaxation.  Baseline:  Goal status: INITIAL  5.  Pt will be able to perform active pelvic floor muscle contraction and relaxation through full ROM in order to improve coordination to lengthen external anal sphincter during bowel movement to help achieve complete emptying.  Baseline:  Goal status: INITIAL   LONG TERM GOALS: Target date: 09/14/2023  Pt will be independent with advanced HEP.   Baseline:  Goal status: INITIAL  2.  Pt will have daily bowel movement with complete evacuation, no straining, and appropriate body mechanics.  Baseline:  Goal status: INITIAL  3.  Pt will demonstrate pelvic floor muscle strength 4/5 with appropriate coordination and relaxation afterwards in order to have normal lengthening during bowel movement.  Baseline:  Goal status: INITIAL    PLAN:  PT FREQUENCY: 1-2x/week  PT DURATION: 6 months   PLANNED INTERVENTIONS: 97110-Therapeutic exercises, 97530- Therapeutic activity, [id number]- Neuromuscular re-education, 97535- Self Care, 02859- Manual therapy, 20560 (1-2 muscles), 20561 (3+ muscles), 02886- Aquatic Therapy, and Biofeedback  PLAN FOR NEXT SESSION: rehabilitative ultrasound imaging for improved coordination and facilitation of pelvic floor muscles; manual techniques and dry needling  Josette Mares, PT, DPT07/23/2510:30 AM

## 2024-03-29 ENCOUNTER — Ambulatory Visit: Attending: Surgery

## 2024-03-29 ENCOUNTER — Other Ambulatory Visit: Payer: Self-pay

## 2024-03-29 DIAGNOSIS — M62838 Other muscle spasm: Secondary | ICD-10-CM | POA: Insufficient documentation

## 2024-03-29 DIAGNOSIS — M6281 Muscle weakness (generalized): Secondary | ICD-10-CM | POA: Diagnosis present

## 2024-03-29 DIAGNOSIS — R293 Abnormal posture: Secondary | ICD-10-CM | POA: Insufficient documentation

## 2024-03-29 DIAGNOSIS — R279 Unspecified lack of coordination: Secondary | ICD-10-CM | POA: Diagnosis present

## 2024-03-29 NOTE — Patient Instructions (Signed)
 Squatty potty: When your knees are level or below the level of your hips, pelvic floor muscles are pressed against rectum, preventing ease of bowel movement. By getting knees above the level of the hips, these pelvic floor muscles relax, allowing easier passage of bowel movement. Ways to get knees above hips: o Squatty Potty (7inch and 9inch versions) o Small stool o Roll of toilet paper under each foot o Hardback book or stack of magazines under each foot  Relaxed Toileting mechanics: Once in this position, make sure to lean forward with forearms on thighs, wide knees, relaxed stomach, and breathe.    Gentle cleaning techniques: Water wipes Peri bottle Bidet   Bowel massage: To assist with more regular and more comfortable bowel movements, try performing bowel massage nightly for 5-10 minutes. Place hands in the lower right side of your abdomen to start; in small circles, massage up, across, and down the left side of your abdomen. Pressure does not need to be hard, but just comfortable. You can use lotion or oil to make more comfortable.    [facility] [address], [address] [place], [place] [address] Phone # [phone] Fax [phone]

## 2024-04-19 ENCOUNTER — Telehealth: Payer: Self-pay

## 2024-04-19 ENCOUNTER — Ambulatory Visit: Attending: Surgery

## 2024-04-19 DIAGNOSIS — M62838 Other muscle spasm: Secondary | ICD-10-CM | POA: Insufficient documentation

## 2024-04-19 DIAGNOSIS — R293 Abnormal posture: Secondary | ICD-10-CM | POA: Insufficient documentation

## 2024-04-19 DIAGNOSIS — M6281 Muscle weakness (generalized): Secondary | ICD-10-CM | POA: Insufficient documentation

## 2024-04-19 DIAGNOSIS — R279 Unspecified lack of coordination: Secondary | ICD-10-CM | POA: Insufficient documentation

## 2024-04-19 NOTE — Telephone Encounter (Signed)
 Called and left patient message after no-show appointment on 04/19/24. He was told next appointment. He was informed of attendance policy and that his appointments will be cancelled after 2nd no-show.

## 2024-04-27 ENCOUNTER — Ambulatory Visit

## 2024-04-27 DIAGNOSIS — R293 Abnormal posture: Secondary | ICD-10-CM | POA: Diagnosis present

## 2024-04-27 DIAGNOSIS — M6281 Muscle weakness (generalized): Secondary | ICD-10-CM | POA: Diagnosis present

## 2024-04-27 DIAGNOSIS — M62838 Other muscle spasm: Secondary | ICD-10-CM

## 2024-04-27 DIAGNOSIS — R279 Unspecified lack of coordination: Secondary | ICD-10-CM | POA: Diagnosis present

## 2024-04-27 NOTE — Patient Instructions (Signed)

## 2024-04-27 NOTE — Therapy (Signed)
 OUTPATIENT PHYSICAL THERAPY MALE PELVIC TREATMENT   Patient Name: Theodore Sanchez MRN: [id number] DOB:1976-04-15, 48 y.o., male Today's Date: 04/27/2024  END OF SESSION:  PT End of Session - 04/27/24 1445     Visit Number 2    Date for PT Re-Evaluation 09/13/24    Authorization Type [employer]    Authorization Time Period pt signed dn consent form on 04/27/24    PT Start Time 1445    PT Stop Time 1525    PT Time Calculation (min) 40 min    Activity Tolerance Patient tolerated treatment well    Behavior During Therapy [facility] for tasks assessed/performed          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   Past Surgical History:  Procedure Laterality Date   ANTERIOR CRUCIATE LIGAMENT REPAIR     2009    ANTERIOR CRUCIATE LIGAMENT REPAIR Right    dog bite surgery      age 48 - plastic surgery on face    EVALUATION UNDER ANESTHESIA WITH HEMORRHOIDECTOMY N/A 02/25/2023   Procedure: EXAM UNDER ANESTHESIA WITH HEMORRHOIDECTOMY;  Surgeon: Teresa Lonni HERO, MD;  Location: [facility] OR;  Service: General;  Laterality: N/A;   EXCISION OF SKIN TAG Right 07/21/2023   Procedure: EXCISION OF PERIANAL  SKIN TAGS X2;  Surgeon: Teresa Lonni HERO, MD;  Location: [facility] OR;  Service: General;  Laterality: Right;   EYE SURGERY     knife in eye surgery- left eye    EYE SURGERY     right eye- due to dirt in eye    ORIF PATELLA Right 09/19/2013   Procedure: OPEN REDUCTION INTERNAL (ORIF) FIXATION RIGHT KNEE PATELLA;  Surgeon: Lamar Collet, MD;  Location: [facility];  Service: Orthopedics;  Laterality: Right;   PATELLA FRACTURE SURGERY Right    Patient Active Problem List   Diagnosis Date Noted   History of colon polyps 02/07/2023   Tourette's syndrome 10/09/2022   Cannabis use with anxiety disorder (HCC) 10/09/2022   Thrombosed hemorrhoids 12/31/2020   FH: hypothyroidism 12/13/2012   Alopecia    Anxiety     PCP: Jesus Bernardino MATSU, MD  REFERRING PROVIDER: Teresa Lonni HERO, MD   REFERRING DIAG: [phone] (ICD-10-CM) - Other specified disorders of muscle  THERAPY DIAG:  Other muscle spasm  Unspecified lack of coordination  Abnormal posture  Muscle weakness (generalized)  Rationale for Evaluation and Treatment: Rehabilitation  ONSET DATE: 03/29/2018  SUBJECTIVE:  SUBJECTIVE STATEMENT: Pt states that he is doing much better with using squatty potty and better toilet posture. He feels like he might be taking too many fiber pills - he takes 10 a day.    PAIN:  Are you having pain? No   PRECAUTIONS: None  RED FLAGS: None   WEIGHT BEARING RESTRICTIONS: No  FALLS:  Has patient fallen in last 6 months? No  OCCUPATION: bail bondsman  ACTIVITY LEVEL : weight lifting and boxing on regular basis  PLOF: Independent  PATIENT GOALS: to empty more completely with bowel movements   PERTINENT HISTORY:  Hemorrhoidectomy 6/24, removal of hemorrhoids/skin tags 11/24, anxiety,    BOWEL MOVEMENT: Pain with bowel movement: No Type of bowel movement:Type (Bristol Stool Scale) 4, Frequency 1x/day, and Strain no straining Fully empty rectum: No Leakage: No Pads: No Fiber supplement/laxative Yes fiber and stool softener He does feel a lot of sensitivity surrounding anus that started after second surgery  URINATION: Pain with urination: No Fully empty bladder: Yes: - Stream: Strong Urgency: No Frequency: WNL Fluid Intake: a lot of water Leakage: none Pads: No  INTERCOURSE:  WNL   OBJECTIVE:  Note: Objective measures were completed at Evaluation unless otherwise noted.  03/29/24: PATIENT SURVEYS:   PFIQ-7: 57  COGNITION: Overall cognitive status: Within functional limits for tasks  assessed     SENSATION: Light touch: Appears intact GAIT: Assistive device utilized: None Comments: Forward flexed posture  POSTURE: rounded shoulders, forward head, decreased lumbar lordosis, increased thoracic kyphosis, posterior pelvic tilt, and gluteal clenching   PALPATION:   General: gluteal clenching/tightness  Abdominal: tightness                External Perineal Exam: increased sensitivity (Lt>Rt)                             Internal Pelvic Floor: scar tissue restriction Lt side; significant discomfort throughout exam  Patient confirms identification and approves PT to assess internal pelvic floor and treatment Yes  PELVIC MMT:   MMT eval  Internal Anal Sphincter 0/5  External Anal Sphincter 0/5  Puborectalis 0/5  (Blank rows = not tested)        TONE: high   TODAY'S TREATMENT:                                                                                                                              DATE:  04/27/24 Trigger Point Dry Needling  Initial Treatment: Pt instructed on Dry Needling rational, procedures, and possible side effects. Pt instructed to expect mild to moderate muscle soreness later in the day and/or into the next day.  Pt instructed in methods to reduce muscle soreness. Pt instructed to continue prescribed HEP. Patient was educated on signs and symptoms of infection and other risk factors and advised to seek medical attention should they occur.  Patient verbalized understanding of these instructions and  education.   Patient Verbal Consent Given: Yes Education Handout Provided: Yes Muscles Treated: attempted single needle at Rt L4 but do to pain we immediately stopped Electrical Stimulation Performed: No Treatment Response/Outcome: poor - stopped after single needle  Manual: Prone myofascial release to thoracolumbar fascia Prone soft tissue mobilization to lumbar paraspinals  Neuromuscular re-education: Resisted supine thigh press  10x bil Child's pose 10 breaths  Modified happy baby 10 breaths Butterfly 10 breaths  Transversus abdominus training with multimodal cues for improved motor control and breath coordination  Exercises: Open books 8x bil Cat cow 2 x10 Therapeutic activities: Pt education on safety of dry needling and purpose Benefit of deadlifts in his work outs instead of just squats - proper form - keeping weights light   03/29/24 EVAL  Neuromuscular re-education: Down training program given Cat cow Butterfly Child's pose Happy baby Diaphragmatic breathing for improved pelvic floor muscle relaxation/lengthening Therapeutic activities: Gentle cleansing techniques when he cannot  get directly into the shower Bowel mobilization Squatty potty/relaxation and appropriate posture on toilet Exhale with pushing - blowing up a balloon or through a straw during bowel movements     PATIENT EDUCATION:  Education details: See above Person educated: Patient Education method: Explanation, Demonstration, Tactile cues, Verbal cues, and Handouts Education comprehension: verbalized understanding  HOME EXERCISE PROGRAM: [id number]  ASSESSMENT:  CLINICAL IMPRESSION: Patient is a 48 y.o. male who was seen today for physical therapy evaluation and treatment for incomplete bowel emptying. Pt has had large improvement in bowel movements with use of squatty potty and improved toilet mechanics. Due to low back restriction that is possibly impacting bowel movements and causing pain, we discussed benefit of dry needling. He was willing to pay extra charge and wanted to try; we did not continue after one needle to L4 Rt multifidi due to very painful response and inability to relax. We did perform manual techniques with good tolerance. We went over down training exercises and progressed mobility. We discussed and practice core activation and he was encouraged to work on activating during lifting exercises; he was encouraged  to return to light weight dead lifts. He will continue to benefit from skilled PT intervention in order to improve complete evacuation with bowel movements, increase external anal sphincter and pelvic floor muscle coordination, address all impairments, and improve quality of life.   OBJECTIVE IMPAIRMENTS: decreased activity tolerance, decreased coordination, decreased endurance, decreased mobility, decreased ROM, decreased strength, increased fascial restrictions, increased muscle spasms, impaired flexibility, impaired tone, improper body mechanics, postural dysfunction, and pain.   ACTIVITY LIMITATIONS: bowel movements   PARTICIPATION LIMITATIONS: community activity, occupation, and toileting  PERSONAL FACTORS: 1 comorbidity: medical history are also affecting patient's functional outcome.   REHAB POTENTIAL: Good  CLINICAL DECISION MAKING: Stable/uncomplicated  EVALUATION COMPLEXITY: Low   GOALS: Goals reviewed with patient? Yes  SHORT TERM GOALS: Updated 04/27/24   Pt will be independent with HEP.   Baseline: Goal status: IN PROGRESS 04/27/24  2.  Pt will be independent with gentle cleansing techniques after bowel movements in order to decrease sensitivity and discomfort at anus.  Baseline:  Goal status: MET 04/27/24  3.  Pt will be independent with use of squatty potty, relaxed toileting mechanics, and improved bowel movement techniques in order to increase ease of bowel movements and complete evacuation.   Baseline:  Goal status: MET 04/27/24  4.  Pt will be independent with diaphragmatic breathing and down training activities in order to improve pelvic floor relaxation.  Baseline:  Goal  status: IN PROGRESS 04/27/24  5.  Pt will be able to perform active pelvic floor muscle contraction and relaxation through full ROM in order to improve coordination to lengthen external anal sphincter during bowel movement to help achieve complete emptying.  Baseline:  Goal status: IN  PROGRESS 04/27/24   LONG TERM GOALS: Updated 04/27/24  Pt will be independent with advanced HEP.   Baseline:  Goal status: IN PROGRESS 04/27/24  2.  Pt will have daily bowel movement with complete evacuation, no straining, and appropriate body mechanics.  Baseline:  Goal status: IN PROGRESS 04/27/24  3.  Pt will demonstrate pelvic floor muscle strength 4/5 with appropriate coordination and relaxation afterwards in order to have normal lengthening during bowel movement.  Baseline:  Goal status: IN PROGRESS 04/27/24    PLAN:  PT FREQUENCY: 1-2x/week  PT DURATION: 6 months   PLANNED INTERVENTIONS: 97110-Therapeutic exercises, 97530- Therapeutic activity, 97112- Neuromuscular re-education, 97535- Self Care, 02859- Manual therapy, 20560 (1-2 muscles), 20561 (3+ muscles), 02886- Aquatic Therapy, and Biofeedback  PLAN FOR NEXT SESSION: rehabilitative ultrasound imaging for improved coordination and facilitation of pelvic floor muscles; manual techniques and dry needling   Josette Mares, PT, DPT08/21/253:36 PM

## 2024-05-17 ENCOUNTER — Ambulatory Visit: Attending: Surgery

## 2024-05-17 DIAGNOSIS — M6281 Muscle weakness (generalized): Secondary | ICD-10-CM | POA: Diagnosis present

## 2024-05-17 DIAGNOSIS — R293 Abnormal posture: Secondary | ICD-10-CM | POA: Insufficient documentation

## 2024-05-17 DIAGNOSIS — R279 Unspecified lack of coordination: Secondary | ICD-10-CM | POA: Diagnosis present

## 2024-05-17 DIAGNOSIS — M62838 Other muscle spasm: Secondary | ICD-10-CM | POA: Insufficient documentation

## 2024-05-17 NOTE — Therapy (Signed)
 OUTPATIENT PHYSICAL THERAPY MALE PELVIC TREATMENT   Patient Name: Theodore Sanchez MRN: [id number] DOB:11-07-1975, 48 y.o., male Today's Date: 05/17/2024  END OF SESSION:  PT End of Session - 05/17/24 1233     Visit Number 3    Date for PT Re-Evaluation 09/13/24    Authorization Type [employer]    Authorization Time Period pt signed dn consent form on 04/27/24    PT Start Time 1233    PT Stop Time 1311    PT Time Calculation (min) 38 min    Activity Tolerance Patient tolerated treatment well    Behavior During Therapy [facility] for tasks assessed/performed           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   Past Surgical History:  Procedure Laterality Date   ANTERIOR CRUCIATE LIGAMENT REPAIR     2009    ANTERIOR CRUCIATE LIGAMENT REPAIR Right    dog bite surgery      age 69 - plastic surgery on face    EVALUATION UNDER ANESTHESIA WITH HEMORRHOIDECTOMY N/A 02/25/2023   Procedure: EXAM UNDER ANESTHESIA WITH HEMORRHOIDECTOMY;  Surgeon: Teresa Lonni HERO, MD;  Location: [facility] OR;  Service: General;  Laterality: N/A;   EXCISION OF SKIN TAG Right 07/21/2023   Procedure: EXCISION OF PERIANAL  SKIN TAGS X2;  Surgeon: Teresa Lonni HERO, MD;  Location: [facility] OR;  Service: General;  Laterality: Right;   EYE SURGERY     knife in eye surgery- left eye    EYE SURGERY     right eye- due to dirt in eye    ORIF PATELLA Right 09/19/2013   Procedure: OPEN REDUCTION INTERNAL (ORIF) FIXATION RIGHT KNEE PATELLA;  Surgeon: Lamar Collet, MD;  Location: [facility];  Service: Orthopedics;  Laterality: Right;   PATELLA FRACTURE SURGERY Right    Patient Active Problem List   Diagnosis Date Noted   History of colon polyps 02/07/2023   Tourette's syndrome 10/09/2022   Cannabis use with anxiety disorder (HCC) 10/09/2022   Thrombosed hemorrhoids 12/31/2020   FH: hypothyroidism 12/13/2012   Alopecia    Anxiety     PCP: Jesus Bernardino MATSU, MD  REFERRING PROVIDER: Teresa Lonni HERO, MD   REFERRING DIAG: [phone] (ICD-10-CM) - Other specified disorders of muscle  THERAPY DIAG:  Other muscle spasm  Unspecified lack of coordination  Abnormal posture  Muscle weakness (generalized)  Rationale for Evaluation and Treatment: Rehabilitation  ONSET DATE: 03/29/2018  SUBJECTIVE:  SUBJECTIVE STATEMENT: Pt states that he has not been doing exercises like he should be. He states that his low back is not as stiff after last session. He has stopped sleeping in recliner which he feels like is helpful. He has not cut back on fiber. IN attempts to sperad fiber out throughout the day instead of taking 10 pills at night, he has added in some powdered fiber with each meal, but still taking 10 pills of fiber at night.    PAIN:  Are you having pain? No   PRECAUTIONS: None  RED FLAGS: None   WEIGHT BEARING RESTRICTIONS: No  FALLS:  Has patient fallen in last 6 months? No  OCCUPATION: bail bondsman  ACTIVITY LEVEL : weight lifting and boxing on regular basis  PLOF: Independent  PATIENT GOALS: to empty more completely with bowel movements   PERTINENT HISTORY:  Hemorrhoidectomy 6/24, removal of hemorrhoids/skin tags 11/24, anxiety,    BOWEL MOVEMENT: Pain with bowel movement: No Type of bowel movement:Type (Bristol Stool Scale) 4, Frequency 1x/day, and Strain no straining Fully empty rectum: No Leakage: No Pads: No Fiber supplement/laxative Yes fiber and stool softener He does feel a lot of sensitivity surrounding anus that started after second surgery  URINATION: Pain with urination: No Fully empty bladder: Yes: - Stream: Strong Urgency: No Frequency: WNL Fluid Intake: a lot of water Leakage: none Pads:  No  INTERCOURSE:  WNL   OBJECTIVE:  Note: Objective measures were completed at Evaluation unless otherwise noted.  03/29/24: PATIENT SURVEYS:   PFIQ-7: 75  COGNITION: Overall cognitive status: Within functional limits for tasks assessed     SENSATION: Light touch: Appears intact GAIT: Assistive device utilized: None Comments: Forward flexed posture  POSTURE: rounded shoulders, forward head, decreased lumbar lordosis, increased thoracic kyphosis, posterior pelvic tilt, and gluteal clenching   PALPATION:   General: gluteal clenching/tightness  Abdominal: tightness                External Perineal Exam: increased sensitivity (Lt>Rt)                             Internal Pelvic Floor: scar tissue restriction Lt side; significant discomfort throughout exam  Patient confirms identification and approves PT to assess internal pelvic floor and treatment Yes  PELVIC MMT:   MMT eval  Internal Anal Sphincter 0/5  External Anal Sphincter 0/5  Puborectalis 0/5  (Blank rows = not tested)        TONE: high   TODAY'S TREATMENT:                                                                                                                              DATE:  05/17/24 Exercises: Lower trunk rotation 2 x 10 Propped lower trunk rotation 5x bil Single knee to chest 5x bil Supine piriformis stretch 60 sec bil  Supine hamstring 3-way stretch 60 sec each, bil  Ball up wall 10x Therapeutic activities: Reviewed timing of fiber and spreading out throughout the day Decreasing fiber to instructions on the bottle  Trial leaving certain food items out of daily routine in order to see if it will help with constipation   04/27/24 Trigger Point Dry Needling  Initial Treatment: Pt instructed on Dry Needling rational, procedures, and possible side effects. Pt instructed to expect mild to moderate muscle soreness later in the day and/or into the next day.  Pt instructed in methods to  reduce muscle soreness. Pt instructed to continue prescribed HEP. Patient was educated on signs and symptoms of infection and other risk factors and advised to seek medical attention should they occur.  Patient verbalized understanding of these instructions and education.   Patient Verbal Consent Given: Yes Education Handout Provided: Yes Muscles Treated: attempted single needle at Rt L4 but do to pain we immediately stopped Electrical Stimulation Performed: No Treatment Response/Outcome: poor - stopped after single needle  Manual: Prone myofascial release to thoracolumbar fascia Prone soft tissue mobilization to lumbar paraspinals  Neuromuscular re-education: Resisted supine thigh press 10x bil Child's pose 10 breaths  Modified happy baby 10 breaths Butterfly 10 breaths  Transversus abdominus training with multimodal cues for improved motor control and breath coordination  Exercises: Open books 8x bil Cat cow 2 x10 Therapeutic activities: Pt education on safety of dry needling and purpose Benefit of deadlifts in his work outs instead of just squats - proper form - keeping weights light   03/29/24 EVAL  Neuromuscular re-education: Down training program given Cat cow Butterfly Child's pose Happy baby Diaphragmatic breathing for improved pelvic floor muscle relaxation/lengthening Therapeutic activities: Gentle cleansing techniques when he cannot  get directly into the shower Bowel mobilization Squatty potty/relaxation and appropriate posture on toilet Exhale with pushing - blowing up a balloon or through a straw during bowel movements     PATIENT EDUCATION:  Education details: See above Person educated: Patient Education method: Explanation, Demonstration, Tactile cues, Verbal cues, and Handouts Education comprehension: verbalized understanding  HOME EXERCISE PROGRAM: [id number]  ASSESSMENT:  CLINICAL IMPRESSION: Patient is a 48 y.o. male who was seen today for  physical therapy evaluation and treatment for incomplete bowel emptying. Pt has not changed fiber, but feels like he can't because he has tried in the past and had things not empty as well with bowel movements. We discussed that various foods and substances can be constipating and possibly performing trials to see if leaving certain items out could be beneficial just to provide him with information. He could possibly learn techniques  for when he is unable to work out and still have bowel movements. We worked on overall mobility today and updated HEP. He will continue to benefit from skilled PT intervention in order to improve complete evacuation with bowel movements, increase external anal sphincter and pelvic floor muscle coordination, address all impairments, and improve quality of life.   OBJECTIVE IMPAIRMENTS: decreased activity tolerance, decreased coordination, decreased endurance, decreased mobility, decreased ROM, decreased strength, increased fascial restrictions, increased muscle spasms, impaired flexibility, impaired tone, improper body mechanics, postural dysfunction, and pain.   ACTIVITY LIMITATIONS: bowel movements   PARTICIPATION LIMITATIONS: community activity, occupation, and toileting  PERSONAL FACTORS: 1 comorbidity: medical history are also affecting patient's functional outcome.   REHAB POTENTIAL: Good  CLINICAL DECISION MAKING: Stable/uncomplicated  EVALUATION COMPLEXITY: Low   GOALS: Goals reviewed with patient? Yes  SHORT TERM GOALS: Updated 04/27/24   Pt will be independent with HEP.  Baseline: Goal status: IN PROGRESS 04/27/24  2.  Pt will be independent with gentle cleansing techniques after bowel movements in order to decrease sensitivity and discomfort at anus.  Baseline:  Goal status: MET 04/27/24  3.  Pt will be independent with use of squatty potty, relaxed toileting mechanics, and improved bowel movement techniques in order to increase ease of bowel  movements and complete evacuation.   Baseline:  Goal status: MET 04/27/24  4.  Pt will be independent with diaphragmatic breathing and down training activities in order to improve pelvic floor relaxation.  Baseline:  Goal status: IN PROGRESS 04/27/24  5.  Pt will be able to perform active pelvic floor muscle contraction and relaxation through full ROM in order to improve coordination to lengthen external anal sphincter during bowel movement to help achieve complete emptying.  Baseline:  Goal status: IN PROGRESS 04/27/24   LONG TERM GOALS: Updated 04/27/24  Pt will be independent with advanced HEP.   Baseline:  Goal status: IN PROGRESS 04/27/24  2.  Pt will have daily bowel movement with complete evacuation, no straining, and appropriate body mechanics.  Baseline:  Goal status: IN PROGRESS 04/27/24  3.  Pt will demonstrate pelvic floor muscle strength 4/5 with appropriate coordination and relaxation afterwards in order to have normal lengthening during bowel movement.  Baseline:  Goal status: IN PROGRESS 04/27/24    PLAN:  PT FREQUENCY: 1-2x/week  PT DURATION: 6 months   PLANNED INTERVENTIONS: 97110-Therapeutic exercises, 97530- Therapeutic activity, 97112- Neuromuscular re-education, 97535- Self Care, 02859- Manual therapy, 20560 (1-2 muscles), 20561 (3+ muscles), 02886- Aquatic Therapy, and Biofeedback  PLAN FOR NEXT SESSION: rehabilitative ultrasound imaging for improved coordination and facilitation of pelvic floor muscles; manual techniques and dry needling   Josette Mares, PT, DPT09/10/251:08 PM

## 2024-05-24 ENCOUNTER — Ambulatory Visit

## 2024-05-24 DIAGNOSIS — R293 Abnormal posture: Secondary | ICD-10-CM

## 2024-05-24 DIAGNOSIS — R279 Unspecified lack of coordination: Secondary | ICD-10-CM

## 2024-05-24 DIAGNOSIS — M62838 Other muscle spasm: Secondary | ICD-10-CM

## 2024-05-24 DIAGNOSIS — M6281 Muscle weakness (generalized): Secondary | ICD-10-CM

## 2024-05-24 NOTE — Therapy (Addendum)
 OUTPATIENT PHYSICAL THERAPY MALE PELVIC TREATMENT   Patient Name: Theodore Sanchez MRN: [id number] DOB:[birth year], 48 y.o., male Today's Date: 05/24/2024  END OF SESSION:  PT End of Session - 05/24/24 1234     Visit Number 4    Date for PT Re-Evaluation 09/13/24    Authorization Type [employer]    Authorization Time Period pt signed dn consent form on 04/27/24    PT Start Time 1231    PT Stop Time 1311    PT Time Calculation (min) 40 min    Activity Tolerance Patient tolerated treatment well    Behavior During Therapy [facility] for tasks assessed/performed           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   Past Surgical History:  Procedure Laterality Date   ANTERIOR CRUCIATE LIGAMENT REPAIR     2009    ANTERIOR CRUCIATE LIGAMENT REPAIR Right    dog bite surgery      age 72 - plastic surgery on face    EVALUATION UNDER ANESTHESIA WITH HEMORRHOIDECTOMY N/A 02/25/2023   Procedure: EXAM UNDER ANESTHESIA WITH HEMORRHOIDECTOMY;  Surgeon: Teresa Lonni HERO, MD;  Location: [facility] OR;  Service: General;  Laterality: N/A;   EXCISION OF SKIN TAG Right 07/21/2023   Procedure: EXCISION OF PERIANAL  SKIN TAGS X2;  Surgeon: Teresa Lonni HERO, MD;  Location: [facility] OR;  Service: General;  Laterality: Right;   EYE SURGERY     knife in eye surgery- left eye    EYE SURGERY     right eye- due to dirt in eye    ORIF PATELLA Right 09/19/2013   Procedure: OPEN REDUCTION INTERNAL (ORIF) FIXATION RIGHT KNEE PATELLA;  Surgeon: Lamar Collet, MD;  Location: [facility];  Service: Orthopedics;  Laterality: Right;   PATELLA FRACTURE SURGERY Right    Patient Active Problem List   Diagnosis Date Noted   History of colon polyps 02/07/2023   Tourette's syndrome 10/09/2022   Cannabis use with anxiety disorder (HCC) 10/09/2022   Thrombosed hemorrhoids 12/31/2020   FH: hypothyroidism 12/13/2012   Alopecia    Anxiety     PCP: Jesus Bernardino MATSU, MD  REFERRING PROVIDER: Teresa Lonni HERO, MD   REFERRING DIAG: [phone] (ICD-10-CM) - Other specified disorders of muscle  THERAPY DIAG:  Other muscle spasm  Unspecified lack of coordination  Abnormal posture  Muscle weakness (generalized)  Rationale for Evaluation and Treatment: Rehabilitation  ONSET DATE: 03/29/2018  SUBJECTIVE:  SUBJECTIVE STATEMENT: Pt states that he continues to do well having regular bowel movements after he goes to the gym. He feels like he is not empty when he finishes having bowel movement, but then once he's up and moving he feels fine. He has not been working on stretches as much as he would like to do.    PAIN:  Are you having pain? No   PRECAUTIONS: None  RED FLAGS: None   WEIGHT BEARING RESTRICTIONS: No  FALLS:  Has patient fallen in last 6 months? No  OCCUPATION: bail bondsman  ACTIVITY LEVEL : weight lifting and boxing on regular basis  PLOF: Independent  PATIENT GOALS: to empty more completely with bowel movements   PERTINENT HISTORY:  Hemorrhoidectomy 6/24, removal of hemorrhoids/skin tags 11/24, anxiety,    BOWEL MOVEMENT: Pain with bowel movement: No Type of bowel movement:Type (Bristol Stool Scale) 4, Frequency 1x/day, and Strain no straining Fully empty rectum: No Leakage: No Pads: No Fiber supplement/laxative Yes fiber and stool softener He does feel a lot of sensitivity surrounding anus that started after second surgery  URINATION: Pain with urination: No Fully empty bladder: Yes: - Stream: Strong Urgency: No Frequency: WNL Fluid Intake: a lot of water Leakage: none Pads: No  INTERCOURSE:  WNL   OBJECTIVE:  Note: Objective measures were completed at Evaluation unless otherwise noted.  03/29/24: PATIENT  SURVEYS:   PFIQ-7: 22  COGNITION: Overall cognitive status: Within functional limits for tasks assessed     SENSATION: Light touch: Appears intact GAIT: Assistive device utilized: None Comments: Forward flexed posture  POSTURE: rounded shoulders, forward head, decreased lumbar lordosis, increased thoracic kyphosis, posterior pelvic tilt, and gluteal clenching   PALPATION:   General: gluteal clenching/tightness  Abdominal: tightness                External Perineal Exam: increased sensitivity (Lt>Rt)                             Internal Pelvic Floor: scar tissue restriction Lt side; significant discomfort throughout exam  Patient confirms identification and approves PT to assess internal pelvic floor and treatment Yes  PELVIC MMT:   MMT eval  Internal Anal Sphincter 0/5  External Anal Sphincter 0/5  Puborectalis 0/5  (Blank rows = not tested)        TONE: high   TODAY'S TREATMENT:                                                                                                                              DATE:  05/24/24 Exercises: Lower trunk rotation 2 x 10 Single knee to chest 10x bil Supine piriformis stretch 60 sec bil Supine foam roller length wise: Alternating shoulder flexion 10x Goal post arms 10 breaths Bear bug/abduction 10x Marching 2 x 10 Thread the needle 3x bil Seated thoracic extension over foam roller 10x Ball up wall 10x Kneeling hip  flexor stretch 60 sec bil  05/17/24 Exercises: Lower trunk rotation 2 x 10 Propped lower trunk rotation 5x bil Single knee to chest 5x bil Supine piriformis stretch 60 sec bil  Supine hamstring 3-way stretch 60 sec each, bil Ball up wall 10x Therapeutic activities: Reviewed timing of fiber and spreading out throughout the day Decreasing fiber to instructions on the bottle  Trial leaving certain food items out of daily routine in order to see if it will help with constipation   04/27/24 Trigger Point Dry  Needling  Initial Treatment: Pt instructed on Dry Needling rational, procedures, and possible side effects. Pt instructed to expect mild to moderate muscle soreness later in the day and/or into the next day.  Pt instructed in methods to reduce muscle soreness. Pt instructed to continue prescribed HEP. Patient was educated on signs and symptoms of infection and other risk factors and advised to seek medical attention should they occur.  Patient verbalized understanding of these instructions and education.   Patient Verbal Consent Given: Yes Education Handout Provided: Yes Muscles Treated: attempted single needle at Rt L4 but do to pain we immediately stopped Electrical Stimulation Performed: No Treatment Response/Outcome: poor - stopped after single needle  Manual: Prone myofascial release to thoracolumbar fascia Prone soft tissue mobilization to lumbar paraspinals  Neuromuscular re-education: Resisted supine thigh press 10x bil Child's pose 10 breaths  Modified happy baby 10 breaths Butterfly 10 breaths  Transversus abdominus training with multimodal cues for improved motor control and breath coordination  Exercises: Open books 8x bil Cat cow 2 x10 Therapeutic activities: Pt education on safety of dry needling and purpose Benefit of deadlifts in his work outs instead of just squats - proper form - keeping weights light   PATIENT EDUCATION:  Education details: See above Person educated: Patient Education method: Programmer, multimedia, Demonstration, Tactile cues, Verbal cues, and Handouts Education comprehension: verbalized understanding  HOME EXERCISE PROGRAM: [id number]  ASSESSMENT:  CLINICAL IMPRESSION: Patient is a 48 y.o. male who was seen today for physical therapy evaluation and treatment for incomplete bowel emptying. Pt is overall about the same with bowel movements, but has not made any changes incorporating stretches/mobility work or cutting out fiber. We continued to work  on mobility and relaxation training in session today and build upon HEP. He demonstrates significant tightness today, but did stretch out over the course of session. He will continue to benefit from skilled PT intervention in order to improve complete evacuation with bowel movements, increase external anal sphincter and pelvic floor muscle coordination, address all impairments, and improve quality of life.   OBJECTIVE IMPAIRMENTS: decreased activity tolerance, decreased coordination, decreased endurance, decreased mobility, decreased ROM, decreased strength, increased fascial restrictions, increased muscle spasms, impaired flexibility, impaired tone, improper body mechanics, postural dysfunction, and pain.   ACTIVITY LIMITATIONS: bowel movements   PARTICIPATION LIMITATIONS: community activity, occupation, and toileting  PERSONAL FACTORS: 1 comorbidity: medical history are also affecting patient's functional outcome.   REHAB POTENTIAL: Good  CLINICAL DECISION MAKING: Stable/uncomplicated  EVALUATION COMPLEXITY: Low   GOALS: Goals reviewed with patient? Yes  SHORT TERM GOALS: Updated 04/27/24   Pt will be independent with HEP.   Baseline: Goal status: IN PROGRESS 04/27/24  2.  Pt will be independent with gentle cleansing techniques after bowel movements in order to decrease sensitivity and discomfort at anus.  Baseline:  Goal status: MET 04/27/24  3.  Pt will be independent with use of squatty potty, relaxed toileting mechanics, and improved bowel movement techniques in order  to increase ease of bowel movements and complete evacuation.   Baseline:  Goal status: MET 04/27/24  4.  Pt will be independent with diaphragmatic breathing and down training activities in order to improve pelvic floor relaxation.  Baseline:  Goal status: IN PROGRESS 04/27/24  5.  Pt will be able to perform active pelvic floor muscle contraction and relaxation through full ROM in order to improve coordination  to lengthen external anal sphincter during bowel movement to help achieve complete emptying.  Baseline:  Goal status: IN PROGRESS 04/27/24   LONG TERM GOALS: Updated 04/27/24  Pt will be independent with advanced HEP.   Baseline:  Goal status: IN PROGRESS 04/27/24  2.  Pt will have daily bowel movement with complete evacuation, no straining, and appropriate body mechanics.  Baseline:  Goal status: IN PROGRESS 04/27/24  3.  Pt will demonstrate pelvic floor muscle strength 4/5 with appropriate coordination and relaxation afterwards in order to have normal lengthening during bowel movement.  Baseline:  Goal status: IN PROGRESS 04/27/24    PLAN:  PT FREQUENCY: 1-2x/week  PT DURATION: 6 months   PLANNED INTERVENTIONS: 97110-Therapeutic exercises, 97530- Therapeutic activity, 97112- Neuromuscular re-education, 97535- Self Care, 02859- Manual therapy, 20560 (1-2 muscles), 20561 (3+ muscles), 02886- Aquatic Therapy, and Biofeedback  PLAN FOR NEXT SESSION: rehabilitative ultrasound imaging for improved coordination and facilitation of pelvic floor muscles; manual techniques and dry needling   Josette Mares, PT, DPT09/17/2512:35 PM  PHYSICAL THERAPY DISCHARGE SUMMARY  Visits from Start of Care: 4  Current functional level related to goals / functional outcomes: Independent   Remaining deficits: See above   Education / Equipment: HEP   Patient agrees to discharge. Patient goals were met. Patient is being discharged due to being pleased with the current functional level.  Josette Mares, PT, DPT09/24/2512:52 PM

## 2024-05-31 ENCOUNTER — Ambulatory Visit

## 2024-05-31 ENCOUNTER — Telehealth: Payer: Self-pay

## 2024-05-31 NOTE — Telephone Encounter (Signed)
 Called and spoke with patient after second no-show appointment. He feels like he is doing well. We agreed to discharge at this time. He was instructed that he is welcome to return if he has issues in the future, but will need a new referral.
